# Patient Record
Sex: Female | Born: 1945 | Race: White | Hispanic: No | Smoking: Never smoker
Health system: Southern US, Community
[De-identification: ages and names within clinical notes are randomized; demographics above are authoritative.]

## PROBLEM LIST (undated history)

## (undated) DIAGNOSIS — K219 Gastro-esophageal reflux disease without esophagitis: Secondary | ICD-10-CM

## (undated) DIAGNOSIS — E876 Hypokalemia: Secondary | ICD-10-CM

## (undated) DIAGNOSIS — M25511 Pain in right shoulder: Secondary | ICD-10-CM

## (undated) DIAGNOSIS — I4891 Unspecified atrial fibrillation: Secondary | ICD-10-CM

## (undated) DIAGNOSIS — M5030 Other cervical disc degeneration, unspecified cervical region: Secondary | ICD-10-CM

## (undated) DIAGNOSIS — G589 Mononeuropathy, unspecified: Secondary | ICD-10-CM

## (undated) DIAGNOSIS — I340 Nonrheumatic mitral (valve) insufficiency: Secondary | ICD-10-CM

## (undated) DIAGNOSIS — Z9889 Other specified postprocedural states: Secondary | ICD-10-CM

## (undated) DIAGNOSIS — G4730 Sleep apnea, unspecified: Secondary | ICD-10-CM

## (undated) DIAGNOSIS — T8859XA Other complications of anesthesia, initial encounter: Secondary | ICD-10-CM

## (undated) DIAGNOSIS — E785 Hyperlipidemia, unspecified: Secondary | ICD-10-CM

## (undated) DIAGNOSIS — I421 Obstructive hypertrophic cardiomyopathy: Secondary | ICD-10-CM

## (undated) DIAGNOSIS — R609 Edema, unspecified: Secondary | ICD-10-CM

## (undated) DIAGNOSIS — R112 Nausea with vomiting, unspecified: Secondary | ICD-10-CM

## (undated) DIAGNOSIS — Z95810 Presence of automatic (implantable) cardiac defibrillator: Secondary | ICD-10-CM

## (undated) DIAGNOSIS — F419 Anxiety disorder, unspecified: Secondary | ICD-10-CM

## (undated) DIAGNOSIS — K469 Unspecified abdominal hernia without obstruction or gangrene: Secondary | ICD-10-CM

## (undated) DIAGNOSIS — M549 Dorsalgia, unspecified: Secondary | ICD-10-CM

## (undated) DIAGNOSIS — R0602 Shortness of breath: Secondary | ICD-10-CM

## (undated) DIAGNOSIS — T4145XA Adverse effect of unspecified anesthetic, initial encounter: Secondary | ICD-10-CM

## (undated) DIAGNOSIS — IMO0001 Reserved for inherently not codable concepts without codable children: Secondary | ICD-10-CM

## (undated) DIAGNOSIS — Z950 Presence of cardiac pacemaker: Secondary | ICD-10-CM

## (undated) HISTORY — PX: OTHER SURGICAL HISTORY: SHX169

## (undated) HISTORY — PX: PACEMAKER INSERTION: SHX728

## (undated) HISTORY — DX: Nonrheumatic mitral (valve) insufficiency: I34.0

## (undated) HISTORY — DX: Edema, unspecified: R60.9

## (undated) HISTORY — DX: Hyperlipidemia, unspecified: E78.5

## (undated) HISTORY — DX: Obstructive hypertrophic cardiomyopathy: I42.1

## (undated) HISTORY — DX: Hypokalemia: E87.6

## (undated) HISTORY — DX: Unspecified abdominal hernia without obstruction or gangrene: K46.9

## (undated) HISTORY — PX: CORONARY ARTERY BYPASS GRAFT: SHX141

## (undated) HISTORY — PX: VAGINAL HYSTERECTOMY: SUR661

## (undated) HISTORY — PX: MYOMECTOMY: SHX85

## (undated) HISTORY — DX: Unspecified atrial fibrillation: I48.91

## (undated) HISTORY — PX: TONSILLECTOMY: SUR1361

## (undated) HISTORY — DX: Mononeuropathy, unspecified: G58.9

## (undated) HISTORY — DX: Other cervical disc degeneration, unspecified cervical region: M50.30

## (undated) HISTORY — DX: Gastro-esophageal reflux disease without esophagitis: K21.9

## (undated) HISTORY — DX: Pain in right shoulder: M25.511

## (undated) HISTORY — PX: ICD GENERATOR CHANGE: SHX5854

## (undated) HISTORY — DX: Shortness of breath: R06.02

## (undated) HISTORY — DX: Anxiety disorder, unspecified: F41.9

## (undated) HISTORY — PX: CARDIAC CATHETERIZATION: SHX172

## (undated) HISTORY — DX: Sleep apnea, unspecified: G47.30

## (undated) HISTORY — DX: Reserved for inherently not codable concepts without codable children: IMO0001

## (undated) HISTORY — DX: Presence of automatic (implantable) cardiac defibrillator: Z95.810

## (undated) HISTORY — DX: Dorsalgia, unspecified: M54.9

---

## 1998-04-06 ENCOUNTER — Observation Stay (HOSPITAL_COMMUNITY): Admission: EM | Admit: 1998-04-06 | Discharge: 1998-04-07 | Payer: Self-pay | Admitting: Emergency Medicine

## 1998-04-06 ENCOUNTER — Encounter: Payer: Self-pay | Admitting: Cardiology

## 1999-05-09 ENCOUNTER — Encounter: Payer: Self-pay | Admitting: Emergency Medicine

## 1999-05-09 ENCOUNTER — Inpatient Hospital Stay (HOSPITAL_COMMUNITY): Admission: EM | Admit: 1999-05-09 | Discharge: 1999-05-09 | Payer: Self-pay | Admitting: Emergency Medicine

## 2000-07-19 ENCOUNTER — Other Ambulatory Visit: Admission: RE | Admit: 2000-07-19 | Discharge: 2000-07-19 | Payer: Self-pay | Admitting: Family Medicine

## 2001-03-06 ENCOUNTER — Encounter: Payer: Self-pay | Admitting: Internal Medicine

## 2001-03-06 ENCOUNTER — Ambulatory Visit (HOSPITAL_COMMUNITY): Admission: RE | Admit: 2001-03-06 | Discharge: 2001-03-06 | Payer: Self-pay | Admitting: Internal Medicine

## 2002-03-07 ENCOUNTER — Encounter: Payer: Self-pay | Admitting: Emergency Medicine

## 2002-03-07 ENCOUNTER — Emergency Department (HOSPITAL_COMMUNITY): Admission: EM | Admit: 2002-03-07 | Discharge: 2002-03-07 | Payer: Self-pay | Admitting: Emergency Medicine

## 2002-06-04 ENCOUNTER — Ambulatory Visit (HOSPITAL_COMMUNITY): Admission: RE | Admit: 2002-06-04 | Discharge: 2002-06-04 | Payer: Self-pay | Admitting: Internal Medicine

## 2002-06-04 ENCOUNTER — Encounter: Payer: Self-pay | Admitting: Internal Medicine

## 2002-09-20 ENCOUNTER — Encounter: Payer: Self-pay | Admitting: Internal Medicine

## 2002-09-20 ENCOUNTER — Ambulatory Visit (HOSPITAL_COMMUNITY): Admission: RE | Admit: 2002-09-20 | Discharge: 2002-09-20 | Payer: Self-pay | Admitting: Internal Medicine

## 2004-04-21 ENCOUNTER — Ambulatory Visit: Payer: Self-pay | Admitting: Internal Medicine

## 2004-08-06 ENCOUNTER — Ambulatory Visit: Payer: Self-pay | Admitting: Internal Medicine

## 2016-02-11 ENCOUNTER — Telehealth: Payer: Self-pay | Admitting: Internal Medicine

## 2016-02-11 NOTE — Telephone Encounter (Signed)
Records received via mail form [facility]- placed in chart prep room patient needs to make appointment

## 2016-02-17 DIAGNOSIS — M79609 Pain in unspecified limb: Secondary | ICD-10-CM | POA: Insufficient documentation

## 2016-02-17 DIAGNOSIS — M79604 Pain in right leg: Secondary | ICD-10-CM

## 2016-02-17 DIAGNOSIS — IMO0002 Reserved for concepts with insufficient information to code with codable children: Secondary | ICD-10-CM | POA: Insufficient documentation

## 2016-02-17 DIAGNOSIS — I422 Other hypertrophic cardiomyopathy: Secondary | ICD-10-CM | POA: Insufficient documentation

## 2016-02-17 DIAGNOSIS — M7062 Trochanteric bursitis, left hip: Secondary | ICD-10-CM

## 2016-02-17 DIAGNOSIS — M7060 Trochanteric bursitis, unspecified hip: Secondary | ICD-10-CM | POA: Insufficient documentation

## 2016-02-17 DIAGNOSIS — E876 Hypokalemia: Secondary | ICD-10-CM | POA: Insufficient documentation

## 2016-02-17 DIAGNOSIS — S46011A Strain of muscle(s) and tendon(s) of the rotator cuff of right shoulder, initial encounter: Secondary | ICD-10-CM

## 2016-02-17 DIAGNOSIS — M25511 Pain in right shoulder: Secondary | ICD-10-CM | POA: Insufficient documentation

## 2016-02-17 DIAGNOSIS — M542 Cervicalgia: Secondary | ICD-10-CM | POA: Insufficient documentation

## 2016-02-17 DIAGNOSIS — Z780 Asymptomatic menopausal state: Secondary | ICD-10-CM | POA: Insufficient documentation

## 2016-02-17 DIAGNOSIS — I421 Obstructive hypertrophic cardiomyopathy: Secondary | ICD-10-CM | POA: Insufficient documentation

## 2016-02-17 DIAGNOSIS — M5030 Other cervical disc degeneration, unspecified cervical region: Secondary | ICD-10-CM

## 2016-02-17 DIAGNOSIS — G4733 Obstructive sleep apnea (adult) (pediatric): Secondary | ICD-10-CM | POA: Insufficient documentation

## 2016-02-17 DIAGNOSIS — M810 Age-related osteoporosis without current pathological fracture: Secondary | ICD-10-CM | POA: Insufficient documentation

## 2016-02-17 DIAGNOSIS — I340 Nonrheumatic mitral (valve) insufficiency: Secondary | ICD-10-CM | POA: Insufficient documentation

## 2016-02-17 DIAGNOSIS — M12819 Other specific arthropathies, not elsewhere classified, unspecified shoulder: Secondary | ICD-10-CM

## 2016-02-17 DIAGNOSIS — M6580 Other synovitis and tenosynovitis, unspecified site: Secondary | ICD-10-CM | POA: Insufficient documentation

## 2016-02-17 DIAGNOSIS — Z95810 Presence of automatic (implantable) cardiac defibrillator: Secondary | ICD-10-CM | POA: Insufficient documentation

## 2016-03-04 ENCOUNTER — Encounter: Payer: Self-pay | Admitting: Internal Medicine

## 2016-05-13 ENCOUNTER — Encounter: Payer: Self-pay | Admitting: Internal Medicine

## 2016-06-10 ENCOUNTER — Encounter: Payer: Self-pay | Admitting: Internal Medicine

## 2016-07-28 ENCOUNTER — Encounter: Payer: Self-pay | Admitting: Internal Medicine

## 2016-07-30 ENCOUNTER — Encounter: Payer: Self-pay | Admitting: Internal Medicine

## 2016-07-30 ENCOUNTER — Encounter (INDEPENDENT_AMBULATORY_CARE_PROVIDER_SITE_OTHER): Payer: Self-pay

## 2016-07-30 ENCOUNTER — Ambulatory Visit (INDEPENDENT_AMBULATORY_CARE_PROVIDER_SITE_OTHER): Payer: Medicare Other | Admitting: Internal Medicine

## 2016-07-30 VITALS — BP 100/70 | HR 67 | Ht 62.0 in | Wt 154.2 lb

## 2016-07-30 DIAGNOSIS — I422 Other hypertrophic cardiomyopathy: Secondary | ICD-10-CM | POA: Diagnosis not present

## 2016-07-30 DIAGNOSIS — I480 Paroxysmal atrial fibrillation: Secondary | ICD-10-CM

## 2016-07-30 DIAGNOSIS — Z950 Presence of cardiac pacemaker: Secondary | ICD-10-CM | POA: Diagnosis not present

## 2016-07-30 LAB — CUP PACEART INCLINIC DEVICE CHECK
HighPow Impedance: 48 Ohm
HighPow Impedance: 64 Ohm
Implantable Lead Implant Date: 19970916
Implantable Lead Model: 125
Implantable Lead Model: 4269
Implantable Lead Serial Number: 222062
Lead Channel Pacing Threshold Amplitude: 0.5 V
Lead Channel Pacing Threshold Pulse Width: 2 ms
Lead Channel Sensing Intrinsic Amplitude: 20.3 mV
Lead Channel Setting Pacing Amplitude: 2 V
Lead Channel Setting Pacing Amplitude: 2.5 V
Lead Channel Setting Pacing Pulse Width: 2 ms
Lead Channel Setting Sensing Sensitivity: 0.6 mV
MDC IDC LEAD IMPLANT DT: 19970916
MDC IDC LEAD LOCATION: 753859
MDC IDC LEAD LOCATION: 753860
MDC IDC LEAD SERIAL: 279972
MDC IDC MSMT LEADCHNL RA IMPEDANCE VALUE: 381 Ohm
MDC IDC MSMT LEADCHNL RA PACING THRESHOLD PULSEWIDTH: 2 ms
MDC IDC MSMT LEADCHNL RA SENSING INTR AMPL: 1.5 mV
MDC IDC MSMT LEADCHNL RV IMPEDANCE VALUE: 465 Ohm
MDC IDC MSMT LEADCHNL RV PACING THRESHOLD AMPLITUDE: 0.6 V
MDC IDC PG IMPLANT DT: 20040112
MDC IDC SESS DTM: 20180309050000
Pulse Gen Serial Number: 149386

## 2016-07-30 MED ORDER — FUROSEMIDE 40 MG PO TABS: 40.0000 mg | ORAL_TABLET | Freq: Every day | ORAL | 6 refills | Status: DC

## 2016-07-30 MED ORDER — APIXABAN 5 MG PO TABS: 5.0000 mg | ORAL_TABLET | Freq: Two times a day (BID) | ORAL | 11 refills | Status: DC

## 2016-07-30 MED ORDER — APIXABAN 5 MG PO TABS: 5.0000 mg | ORAL_TABLET | Freq: Two times a day (BID) | ORAL | 0 refills | Status: DC

## 2016-07-30 NOTE — Progress Notes (Signed)
ELECTROPHYSIOLOGY CONSULT NOTE  Patient ID: Aimee Edwards, MRN: [id number], DOB/AGE: 1945-11-26 71 y.o. Admit date: (Not on file) Date of Consult: 07/30/2016  Primary Physician: PROVIDER NOT IN SYSTEM Primary Cardiologist: new Consulting Physician new  Chief Complaint: reestablish   HPI Aimee Edwards is a 71 y.o. female  Return here here following many many years of care in [place].  Many years ago, Dr Cecilie Kicks and I sent her t ahistory of hypertrrophic obstructive cardiomyopathy   cardiomyopathy At the [facility] in 1996 and underwent septal myectomy  And ICD implantation.  Records note that there is an issue with atrial lead function.  The patient reports that she was hospitalized at [facility] about 15 years ago because of inappropriate shocks related to atrial fibrillation. She was started on anticoagulation and subsequently this was discontinued by her [facility] physicians. On her own she began aspirin.  She has had progressive shortness of breath and exercise intolerance. She's had significant mitral regurgitation dating back probably 5 or 6 years With comments in the chart about MR over estimation.  She recently saw a different physician who obtained a trans-thoracic echo and made a comment regarding "have to get the ejection fraction back to 35%". Reviewing outpatient records there was an echo 6/17 where in the ejection fraction was noted at 47% again with severe mitral regurgitation; she was also noted to have moderate aortic insufficiency.  She's had no appropriate ICD discharges.           Past Medical History:  Diagnosis Date  . Back pain   . Cardiomyopathy, hypertrophic obstructive (HCC)   . Degeneration of cervical intervertebral disc   . Dyslipidemia   . Dysrhythmia   . Edema   . Gastroesophageal reflux disease   . Hernia, abdominal   . Hypokalemia   . ICD (implantable cardioverter-defibrillator) in place   . Murmur   . Pacemaker   . Pinched nerve   .  Presence of automatic cardioverter/defibrillator (AICD)   . Reflux   . Right shoulder pain   . Shortness of breath   . Shortness of breath on exertion   . Sleep apnea       Surgical History:  Past Surgical History:  Procedure Laterality Date  . CARDIAC CATHETERIZATION    . COLONSCOPY    . CORONARY ARTERY BYPASS GRAFT    . ICD GENERATOR CHANGE    . MYOMECTOMY    . OTHER HEART/PERICARD OPS    . PACEMAKER INSERTION    . TONSILLECTOMY    . VAGINAL HYSTERECTOMY       Home Meds: Prior to Admission medications   Medication Sig Start Date End Date Taking? Authorizing Provider  aspirin EC 81 MG tablet Take 81 mg by mouth daily.   Yes Historical Provider, MD  atorvastatin (LIPITOR) 40 MG tablet Take 40 mg by mouth daily at 6 PM.   Yes Historical Provider, MD  furosemide (LASIX) 20 MG tablet Take 1 tablet by mouth daily. 11/20/15  Yes Historical Provider, MD  lisinopril (PRINIVIL,ZESTRIL) 5 MG tablet Take 5 mg by mouth 2 (two) times daily.    Yes Historical Provider, MD  metoprolol succinate (TOPROL-XL) 25 MG 24 hr tablet Take 25 mg by mouth daily.   Yes Historical Provider, MD  potassium chloride (K-DUR,KLOR-CON) 10 MEQ tablet Take 1 tablet by mouth daily. 11/20/15  Yes Historical Provider, MD  spironolactone (ALDACTONE) 25 MG tablet Take 25 mg by mouth daily.   Yes Historical Provider,  MD    Allergies:  Allergies  Allergen Reactions  . Codeine Nausea And Vomiting  . Lortab [Hydrocodone-Acetaminophen] Nausea And Vomiting    Social History   Social History  . Marital status: Divorced    Spouse name: N/A  . Number of children: N/A  . Years of education: N/A   Occupational History  . Not on file.   Social History Main Topics  . Smoking status: Never Smoker  . Smokeless tobacco: Never Used  . Alcohol use No  . Drug use: No  . Sexual activity: No   Other Topics Concern  . Not on file   Social History Narrative  . No narrative on file     Family History  Problem  Relation Age of Onset  . Heart disease Brother   . Cancer Brother      ROS:  Please see the history of present illness.     All other systems reviewed and negative.    Physical Exam:  Blood pressure 100/70, pulse 67, height 5\' 2"  (1.575 m), weight 154 lb 3.2 oz (69.9 kg), SpO2 96 %. General: Well developed, well nourished female in no acute distress. Head: Normocephalic, atraumatic, sclera non-icteric, no xanthomas, nares are without discharge. EENT: normal  Lymph Nodes:  none Neck: Negative for carotid bruits. JVD not elevated. Back:without scoliosis kyphosis Lungs: Clear bilaterally to auscultation without wheezes, rales, or rhonchi. Breathing is unlabored. Heart: RRR with S1 S2. 2/6 murmur . No rubs, or gallops appreciated. Abdomen: Soft, non-tender, non-distended with normoactive bowel sounds. No hepatomegaly. No rebound/guarding. No obvious abdominal masses. Msk:  Strength and tone appear normal for age. Extremities: No clubbing or cyanosis. No  edema.  Distal pedal pulses are 2+ and equal bilaterally. Skin: Warm and Dry Neuro: Alert and oriented X 3. CN III-XII intact Grossly normal sensory and motor function . Psych:  Responds to questions appropriately with a normal affect.      Labs: Cardiac Enzymes No results for input(s): CKTOTAL, CKMB, TROPONINI in the last 72 hours. CBC No results found for: WBC, HGB, HCT, MCV, PLT PROTIME: No results for input(s): LABPROT, INR in the last 72 hours. Chemistry No results for input(s): NA, K, CL, CO2, BUN, CREATININE, CALCIUM, PROT, BILITOT, ALKPHOS, ALT, AST, GLUCOSE in the last 168 hours.  Invalid input(s): LABALBU Lipids No results found for: CHOL, HDL, LDLCALC, TRIG BNP No results found for: PROBNP Thyroid Function Tests: No results for input(s): TSH, T4TOTAL, T3FREE, THYROIDAB in the last 72 hours.  Invalid input(s): FREET3 Miscellaneous No results found for: DDIMER  Radiology/Studies:  No results found.  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 Indication  Atrial fibrillation-paroxysmal-recurrent with inappropriate ICD shocks  Congestive heart failure-progressive-class III  Mitral regurgitation/aortic regurgitation question severity moderate-severe by interval echoes    The patient presents many years after myectomy with interval reports reviewed from the [facility] program , continue on mitral regurgitation  moderate to severe and most recent echo report June 17 EF 45%. There is a suggestion from that interval echo that there has been significant worsening of LV function to explain progressive shortness of breath and fatigue. I have reviewed this with Dr. Tenny Craw today. Reassessment by transthoracic echo is the first next step. We will attempt to do this by reviewing the most recent echo.  Heart failure status is improved with the up  titration of her Aldactone. We will down try treated back to 25 mg daily we'll increase her furosemide and 20-40. We'll check a metabolic profile. Her heart rate histograms suggest atrial fibrillation though we do not have documentation. The device was programmed D DI-DDD so as to allow us to record atrial electrograms. In the interim, however, given her history of atrial fibrillation of which she is aware we will discontinue aspirin and begin her on apixoban.  She may end up needing trans-esophageal echo; Dr. Tenny Crawoss will help guide that next decision.

## 2016-07-30 NOTE — Patient Instructions (Addendum)
Medication Instructions: - Your physician has recommended you make the following change in your medication: 1) Stop aspirin 2) Decrease aldactone (spironolactone) 25 mg- take one tablet by mouth once daily 3) Increase lasix (furosemide) 40 mg- take one tablet by mouth once daily 4) Start eliquis 5 mg- take one tablet by mouth twice daily  Labwork: - Your physician recommends that you have lab work today: [employer]  Procedures/Testing: - none ordered  Follow-Up: - pending  Any Additional Special Instructions Will Be Listed Below (If Applicable). - Please obtain a copy of your most recent echocardiogram on a disk for Dr. Graciela HusbandsKlein/ Dr. Tenny Crawoss to review (attn: Herbert SetaHeather, RN)     If you need a refill on your cardiac medications before your next appointment, please call your pharmacy.

## 2016-07-31 LAB — BASIC METABOLIC PANEL
BUN / CREAT RATIO: 27 (ref 12–28)
BUN: 17 mg/dL (ref 8–27)
CALCIUM: 9.6 mg/dL (ref 8.7–10.3)
CHLORIDE: 102 mmol/L (ref 96–106)
CO2: 25 mmol/L (ref 18–29)
Creatinine, Ser: 0.63 mg/dL (ref 0.57–1.00)
GFR, EST AFRICAN AMERICAN: 104 mL/min/{1.73_m2} (ref >59)
GFR, EST NON AFRICAN AMERICAN: 91 mL/min/{1.73_m2} (ref >59)
Glucose: 85 mg/dL (ref 65–99)
POTASSIUM: 4.6 mmol/L (ref 3.5–5.2)
Sodium: 145 mmol/L — ABNORMAL HIGH (ref 134–144)

## 2016-08-03 ENCOUNTER — Telehealth: Payer: Self-pay | Admitting: Internal Medicine

## 2016-08-03 ENCOUNTER — Telehealth: Payer: Self-pay

## 2016-08-03 NOTE — Telephone Encounter (Signed)
The patient is aware of her results.  

## 2016-08-03 NOTE — Telephone Encounter (Signed)
Mrs.Bermingham is returning a call . She will be at this number [phone] until  5 or 5:30pm . Please call

## 2016-08-03 NOTE — Telephone Encounter (Signed)
Left message with husband for pateint to call for lab results

## 2016-08-06 ENCOUNTER — Other Ambulatory Visit: Payer: Self-pay | Admitting: Internal Medicine

## 2016-08-06 ENCOUNTER — Encounter: Payer: Self-pay | Admitting: Internal Medicine

## 2016-08-06 MED ORDER — SPIRONOLACTONE 25 MG PO TABS: 25.0000 mg | ORAL_TABLET | Freq: Every day | ORAL | 3 refills | Status: DC

## 2016-08-06 MED ORDER — POTASSIUM CHLORIDE CRYS ER 10 MEQ PO TBCR
10.0000 meq | EXTENDED_RELEASE_TABLET | Freq: Every day | ORAL | 3 refills | Status: DC
Start: 2016-08-06 — End: 2018-01-03

## 2016-08-06 NOTE — Telephone Encounter (Signed)
Pt's Rx was sent to pt's pharmacy as requested. Confirmation received.  °

## 2016-08-10 ENCOUNTER — Encounter: Payer: Self-pay | Admitting: Internal Medicine

## 2016-08-26 ENCOUNTER — Telehealth: Payer: Self-pay | Admitting: Internal Medicine

## 2016-08-26 DIAGNOSIS — I517 Cardiomegaly: Secondary | ICD-10-CM

## 2016-08-26 NOTE — Telephone Encounter (Signed)
I called and spoke with the patient per Dr. Odessa Fleming request. He and Dr. Tenny Craw have looked at the copy of her echo on disk compared to the written report. They are uncertain that what they are seeing on the disk correlates with the written report. He asked that the study be repeated in our office to verify. The patient is agreeable. I advised I will place the order and scheduling will call her to arrange.

## 2016-09-01 ENCOUNTER — Other Ambulatory Visit: Payer: Self-pay

## 2016-09-01 ENCOUNTER — Ambulatory Visit (HOSPITAL_COMMUNITY): Payer: Medicare Other | Attending: Cardiology

## 2016-09-01 DIAGNOSIS — I517 Cardiomegaly: Secondary | ICD-10-CM

## 2016-09-01 DIAGNOSIS — I083 Combined rheumatic disorders of mitral, aortic and tricuspid valves: Secondary | ICD-10-CM | POA: Insufficient documentation

## 2016-09-01 MED ORDER — PERFLUTREN LIPID MICROSPHERE
1.0000 mL | INTRAVENOUS | Status: AC | PRN
  Administered 2016-09-01: 2 mL via INTRAVENOUS

## 2016-09-23 ENCOUNTER — Encounter: Payer: Self-pay | Admitting: Internal Medicine

## 2016-09-30 ENCOUNTER — Telehealth: Payer: Self-pay

## 2016-09-30 NOTE — Telephone Encounter (Signed)
Alternatives to eliquis are pradaxa and xarelto. If she wants to check with her pharmacy and see if they can quote her a cost on these 2 to see if one is more affordable for her.  Otherwise, it would be warfarin.

## 2016-09-30 NOTE — Telephone Encounter (Signed)
Patient calling saying that Eliquis cost her $500 a month and she cannot afford that and would like to know if there is an alternative to medication that Dr. Graciela HusbandsKlein could prescribe that would be more affordable for her. I informed her that Dr. Graciela HusbandsKlein is out of town this entire week and will not be back in the office until next week but that I could send his nurse a message and upon his return we would advise her. She is agreeable to plan.

## 2016-10-04 ENCOUNTER — Encounter: Payer: Self-pay | Admitting: Internal Medicine

## 2016-10-14 ENCOUNTER — Encounter: Payer: Self-pay | Admitting: Internal Medicine

## 2016-10-20 ENCOUNTER — Other Ambulatory Visit: Payer: Self-pay | Admitting: Pharmacist

## 2016-10-20 MED ORDER — WARFARIN SODIUM 5 MG PO TABS: 5.0000 mg | ORAL_TABLET | Freq: Every day | ORAL | 1 refills | Status: DC

## 2016-10-22 ENCOUNTER — Encounter: Payer: Self-pay | Admitting: Internal Medicine

## 2016-10-22 NOTE — Progress Notes (Signed)
F[facility] [place]- these were signed by Dr. Graciela HusbandsKlein for the patient to be followed there (closer to her house). Faxed to [phone]- confirmation received.

## 2016-11-01 ENCOUNTER — Ambulatory Visit (INDEPENDENT_AMBULATORY_CARE_PROVIDER_SITE_OTHER): Payer: Medicare Other | Admitting: *Deleted

## 2016-11-01 ENCOUNTER — Telehealth: Payer: Self-pay | Admitting: *Deleted

## 2016-11-01 DIAGNOSIS — I422 Other hypertrophic cardiomyopathy: Secondary | ICD-10-CM | POA: Diagnosis not present

## 2016-11-01 NOTE — Telephone Encounter (Signed)
Called pt back and let her know that her transmission was received.

## 2016-11-01 NOTE — Telephone Encounter (Signed)
Patient calling, was confused about her home remote check date and would like to verify if transmission was received. Patient states that the light was blinking this morning and would like to verify that transmission was received. Patient asks that you leave message via MyChart.  Thanks.

## 2016-11-01 NOTE — Progress Notes (Signed)
Remote ICD transmission.   

## 2016-11-03 ENCOUNTER — Encounter: Payer: Self-pay | Admitting: Cardiology

## 2016-11-03 ENCOUNTER — Telehealth: Payer: Self-pay | Admitting: Internal Medicine

## 2016-11-03 LAB — CUP PACEART REMOTE DEVICE CHECK
Brady Statistic RA Percent Paced: 1 %
Date Time Interrogation Session: 20180613155411
HighPow Impedance: 64 Ohm
Implantable Lead Model: 4269
Lead Channel Impedance Value: 387 Ohm
MDC IDC LEAD IMPLANT DT: 19970916
MDC IDC LEAD IMPLANT DT: 19970916
MDC IDC LEAD LOCATION: 753859
MDC IDC LEAD LOCATION: 753860
MDC IDC LEAD SERIAL: 222062
MDC IDC LEAD SERIAL: 279972
MDC IDC MSMT LEADCHNL RA SENSING INTR AMPL: 2 mV
MDC IDC MSMT LEADCHNL RV IMPEDANCE VALUE: 469 Ohm
MDC IDC MSMT LEADCHNL RV SENSING INTR AMPL: 19 mV
MDC IDC PG IMPLANT DT: 20101020
MDC IDC PG SERIAL: 149386
MDC IDC STAT BRADY RV PERCENT PACED: 1 %

## 2016-11-03 NOTE — Telephone Encounter (Signed)
New message      Pt is on coumadin.  [place] It [place] 2.6.  Pt is feeling drunk/dizzy since this am. Please advise

## 2016-11-03 NOTE — Telephone Encounter (Signed)
I called and spoke with the patient. She states she woke up this morning feeling "drunk" but she hasn't been drinking anything. She denies headaches. She has not done any blood pressure check as she is at work. I advised her I spoke with our pharmacist in the coumadin clinic and she did not feel that coumadin would cause the patient to feel this way.  The patient has had problems with vertigo in the past, but that felt like the room was spinning and this feels like she is off balance. She has meclizine at home- I have advised her to try this when she gets home to see if it will help. If it does not, I have advised her to follow up with her PCP. She voices understanding.

## 2016-11-03 NOTE — Telephone Encounter (Signed)
Attempted to call the patient- # is ringing busy at this time. Will call back.

## 2016-12-15 ENCOUNTER — Ambulatory Visit (INDEPENDENT_AMBULATORY_CARE_PROVIDER_SITE_OTHER): Payer: Medicare Other | Admitting: Internal Medicine

## 2016-12-15 VITALS — BP 100/66 | HR 69 | Ht 62.0 in | Wt 163.0 lb

## 2016-12-15 DIAGNOSIS — I422 Other hypertrophic cardiomyopathy: Secondary | ICD-10-CM

## 2016-12-15 DIAGNOSIS — E782 Mixed hyperlipidemia: Secondary | ICD-10-CM | POA: Diagnosis not present

## 2016-12-15 DIAGNOSIS — Z95810 Presence of automatic (implantable) cardiac defibrillator: Secondary | ICD-10-CM

## 2016-12-15 DIAGNOSIS — I480 Paroxysmal atrial fibrillation: Secondary | ICD-10-CM | POA: Diagnosis not present

## 2016-12-15 DIAGNOSIS — I509 Heart failure, unspecified: Secondary | ICD-10-CM | POA: Diagnosis not present

## 2016-12-15 DIAGNOSIS — I359 Nonrheumatic aortic valve disorder, unspecified: Secondary | ICD-10-CM

## 2016-12-15 DIAGNOSIS — I059 Rheumatic mitral valve disease, unspecified: Secondary | ICD-10-CM

## 2016-12-15 MED ORDER — WARFARIN SODIUM 5 MG PO TABS: ORAL_TABLET | ORAL | 2 refills | Status: DC

## 2016-12-15 MED ORDER — FUROSEMIDE 40 MG PO TABS: 40.0000 mg | ORAL_TABLET | Freq: Every day | ORAL | 6 refills | Status: DC

## 2016-12-15 MED ORDER — ATORVASTATIN CALCIUM 40 MG PO TABS: 40.0000 mg | ORAL_TABLET | Freq: Every day | ORAL | 6 refills | Status: DC

## 2016-12-15 MED ORDER — METOPROLOL SUCCINATE ER 25 MG PO TB24: 25.0000 mg | ORAL_TABLET | Freq: Every day | ORAL | 6 refills | Status: DC

## 2016-12-15 NOTE — Progress Notes (Signed)
Patient Care Team: System, Provider Not In as PCP - General   HPI  Aimee Edwards is a 71 y.o. female Seen in follow-up for hypertrophic cardiomyopathy status post septal myectomy. She has a history of ICD implantation. She has had interval atrial fibrillation resulting in inappropriate ICD discharges. She's been managed with anticoagulation.  She has a history of mitral regurgitation with comments suggesting severe MR and moderate AI.  Functional status is baseline with mild-moderate shortness of breath.without orthopnea or PND  There is been no edema. No chest pain   4/18 and echocardiogram here demonstrated EF 45-50% with moderate MR and severe LAE (43/2.4/58)    Past Medical History:  Diagnosis Date  . Atrial fibrillation (HCC)   . Back pain   . Cardiomyopathy, hypertrophic obstructive (HCC)   . Degeneration of cervical intervertebral disc   . Dyslipidemia   . Edema   . Gastroesophageal reflux disease   . Hernia, abdominal   . Hypokalemia   . ICD (implantable cardioverter-defibrillator) in place   . Mitral regurgitation   . Pinched nerve   . Reflux   . Right shoulder pain   . Shortness of breath on exertion   . Sleep apnea     Past Surgical History:  Procedure Laterality Date  . CARDIAC CATHETERIZATION    . COLONSCOPY    . CORONARY ARTERY BYPASS GRAFT    . ICD GENERATOR CHANGE    . MYOMECTOMY    . OTHER HEART/PERICARD OPS    . PACEMAKER INSERTION    . TONSILLECTOMY    . VAGINAL HYSTERECTOMY      Current Outpatient Prescriptions  Medication Sig Dispense Refill  . atorvastatin (LIPITOR) 40 MG tablet Take 40 mg by mouth daily at 6 PM.    . furosemide (LASIX) 40 MG tablet Take 1 tablet (40 mg total) by mouth daily. 30 tablet 6  . lisinopril (PRINIVIL,ZESTRIL) 5 MG tablet Take 5 mg by mouth 2 (two) times daily.     . metoprolol succinate (TOPROL-XL) 25 MG 24 hr tablet Take 25 mg by mouth daily.    . potassium chloride (K-DUR,KLOR-CON) 10 MEQ tablet  Take 1 tablet (10 mEq total) by mouth daily. 90 tablet 3  . spironolactone (ALDACTONE) 25 MG tablet Take 1 tablet (25 mg total) by mouth daily. 90 tablet 3  . warfarin (COUMADIN) 5 MG tablet Take 1 tablet (5 mg total) by mouth daily. 30 tablet 1   No current facility-administered medications for this visit.     Allergies  Allergen Reactions  . Codeine Nausea And Vomiting  . Lortab [Hydrocodone-Acetaminophen] Nausea And Vomiting      Review of Systems negative except from HPI and PMH  Physical Exam BP 100/66   Pulse 69   Ht 5\' 2"  (1.575 m)   Wt 163 lb (73.9 kg)   SpO2 98%   BMI 29.81 kg/m  Well developed and well nourished in no acute distress HENT normal E scleral and icterus clear Neck Supple JVP flat; carotids brisk and full Clear to ausculation Device pocket well healed; without hematoma or erythema.  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 Indication  Atrial  fibrillation-paroxysmal-recurrent with inappropriate ICD shocks  Congestive heart failure-progressive-class III  Mitral regurgitation/aortic regurgitation question severity moderate  Labile INR   Have  Encouraged her to look at patient assistance for NOAC rx as an alternative given labile INR  Have spoken with Dr Tenny Crawoss,  MR is central and moderate but not severe >> will continue medical therapy for MR  Medication uptitration limited by BP   No intercurrent atrial fibrillation or flutter  Lots of inappropriate atrial sensing   Impending lead failure       Current medicines are reviewed at length with the patient today .  The patient does not  have concerns regarding  medicines.

## 2016-12-15 NOTE — Patient Instructions (Addendum)
Medication Instructions: - Your physician recommends that you continue on your current medications as directed. Please refer to the Current Medication list given to you today.  Labwork: - Your physician recommends that you have lab work today: BMP/lipid  Procedures/Testing: - none ordered  Follow-Up: - Remote monitoring is used to monitor your Pacemaker of ICD from home. This monitoring reduces the number of office visits required to check your device to one time per year. It allows us to keep an eye on the functioning of your device to ensure it is working properly. You are scheduled for a device check from home on 01/31/17. You may send your transmission at any time that day. If you have a wireless device, the transmission will be sent automatically. After your physician reviews your transmission, you will receive a postcard with your next transmission date.  - Your physician wants you to follow-up in: 6 months with Dr. Graciela HusbandsKlein. You will receive a reminder letter in the mail two months in advance. If you don't receive a letter, please call our office to schedule the follow-up appointment.   Any Additional Special Instructions Will Be Listed Below (If Applicable).     If you need a refill on your cardiac medications before your next appointment, please call your pharmacy.

## 2016-12-16 LAB — BASIC METABOLIC PANEL
BUN / CREAT RATIO: 16 (ref 12–28)
BUN: 14 mg/dL (ref 8–27)
CO2: 26 mmol/L (ref 20–29)
Calcium: 9.3 mg/dL (ref 8.7–10.3)
Chloride: 104 mmol/L (ref 96–106)
Creatinine, Ser: 0.9 mg/dL (ref 0.57–1.00)
GFR calc Af Amer: 74 mL/min/{1.73_m2} (ref >59)
GFR, EST NON AFRICAN AMERICAN: 65 mL/min/{1.73_m2} (ref >59)
Glucose: 98 mg/dL (ref 65–99)
POTASSIUM: 4.1 mmol/L (ref 3.5–5.2)
SODIUM: 144 mmol/L (ref 134–144)

## 2016-12-16 LAB — LIPID PANEL
CHOL/HDL RATIO: 2.8 ratio (ref 0.0–4.4)
Cholesterol, Total: 161 mg/dL (ref 100–199)
HDL: 58 mg/dL (ref >39)
LDL Calculated: 81 mg/dL (ref 0–99)
Triglycerides: 110 mg/dL (ref 0–149)
VLDL Cholesterol Cal: 22 mg/dL (ref 5–40)

## 2016-12-17 ENCOUNTER — Other Ambulatory Visit: Payer: Self-pay | Admitting: Internal Medicine

## 2016-12-17 ENCOUNTER — Encounter: Payer: Self-pay | Admitting: Internal Medicine

## 2016-12-17 MED ORDER — SPIRONOLACTONE 25 MG PO TABS: 25.0000 mg | ORAL_TABLET | Freq: Every day | ORAL | 3 refills | Status: DC

## 2016-12-17 MED ORDER — LISINOPRIL 5 MG PO TABS: 5.0000 mg | ORAL_TABLET | Freq: Two times a day (BID) | ORAL | 3 refills | Status: DC

## 2016-12-17 NOTE — Telephone Encounter (Signed)
Pt's medication was sent to pt's pharmacy as requested. Confirmation received.  °

## 2016-12-21 LAB — CUP PACEART INCLINIC DEVICE CHECK
HIGH POWER IMPEDANCE MEASURED VALUE: 48 Ohm
HighPow Impedance: 63 Ohm
Implantable Lead Implant Date: 19970916
Implantable Lead Location: 753860
Implantable Lead Model: 125
Implantable Lead Model: 4269
Implantable Lead Serial Number: 222062
Implantable Lead Serial Number: 279972
Implantable Pulse Generator Implant Date: 20101020
Lead Channel Impedance Value: 386 Ohm
Lead Channel Impedance Value: 467 Ohm
Lead Channel Pacing Threshold Amplitude: 1.1 V
Lead Channel Pacing Threshold Pulse Width: 0.4 ms
Lead Channel Sensing Intrinsic Amplitude: 1.4 mV
Lead Channel Setting Pacing Amplitude: 2.5 V
Lead Channel Setting Pacing Amplitude: 5 V
Lead Channel Setting Pacing Pulse Width: 0.4 ms
MDC IDC LEAD IMPLANT DT: 19970916
MDC IDC LEAD LOCATION: 753859
MDC IDC MSMT LEADCHNL RA PACING THRESHOLD AMPLITUDE: 4 V
MDC IDC MSMT LEADCHNL RA PACING THRESHOLD PULSEWIDTH: 1 ms
MDC IDC MSMT LEADCHNL RV SENSING INTR AMPL: 19 mV
MDC IDC PG SERIAL: 149386
MDC IDC SESS DTM: 20180725040000
MDC IDC SET LEADCHNL RV SENSING SENSITIVITY: 0.6 mV

## 2017-01-31 ENCOUNTER — Ambulatory Visit (INDEPENDENT_AMBULATORY_CARE_PROVIDER_SITE_OTHER): Payer: Medicare Other | Admitting: *Deleted

## 2017-01-31 DIAGNOSIS — I422 Other hypertrophic cardiomyopathy: Secondary | ICD-10-CM

## 2017-01-31 NOTE — Progress Notes (Signed)
Remote ICD transmission.   

## 2017-02-02 ENCOUNTER — Encounter: Payer: Self-pay | Admitting: Cardiology

## 2017-02-02 LAB — CUP PACEART REMOTE DEVICE CHECK
Battery Remaining Longevity: 48 mo
Brady Statistic RV Percent Paced: 0 %
Date Time Interrogation Session: 20180910053100
HIGH POWER IMPEDANCE MEASURED VALUE: 59 Ohm
Implantable Lead Implant Date: 19970916
Implantable Lead Implant Date: 19970916
Implantable Lead Location: 753859
Implantable Lead Model: 4269
Implantable Lead Serial Number: 222062
Implantable Pulse Generator Implant Date: 20101020
Lead Channel Impedance Value: 449 Ohm
Lead Channel Pacing Threshold Amplitude: 1.1 V
Lead Channel Pacing Threshold Amplitude: 4 V
Lead Channel Pacing Threshold Pulse Width: 0.4 ms
Lead Channel Pacing Threshold Pulse Width: 1 ms
Lead Channel Setting Pacing Amplitude: 2.5 V
Lead Channel Setting Pacing Pulse Width: 0.4 ms
Lead Channel Setting Sensing Sensitivity: 0.6 mV
MDC IDC LEAD LOCATION: 753860
MDC IDC LEAD SERIAL: 279972
MDC IDC MSMT BATTERY REMAINING PERCENTAGE: 57 %
MDC IDC MSMT LEADCHNL RA IMPEDANCE VALUE: 367 Ohm
MDC IDC SET LEADCHNL RA PACING AMPLITUDE: 5 V
MDC IDC STAT BRADY RA PERCENT PACED: 1 %
Pulse Gen Serial Number: 149386

## 2017-05-02 ENCOUNTER — Ambulatory Visit (INDEPENDENT_AMBULATORY_CARE_PROVIDER_SITE_OTHER): Payer: Medicare Other | Admitting: *Deleted

## 2017-05-02 DIAGNOSIS — I422 Other hypertrophic cardiomyopathy: Secondary | ICD-10-CM | POA: Diagnosis not present

## 2017-05-02 NOTE — Progress Notes (Signed)
Remote ICD transmission.   

## 2017-05-06 ENCOUNTER — Encounter: Payer: Self-pay | Admitting: Cardiology

## 2017-05-07 LAB — CUP PACEART REMOTE DEVICE CHECK
Battery Remaining Longevity: 48 mo
Battery Remaining Percentage: 54 %
Brady Statistic RA Percent Paced: 1 %
Brady Statistic RV Percent Paced: 1 %
Date Time Interrogation Session: 20181210064400
HighPow Impedance: 61 Ohm
Implantable Lead Implant Date: 19970916
Implantable Lead Implant Date: 19970916
Implantable Lead Location: 753859
Implantable Lead Location: 753860
Implantable Lead Model: 125
Implantable Lead Model: 4269
Implantable Lead Serial Number: 222062
Implantable Lead Serial Number: 279972
Implantable Pulse Generator Implant Date: 20101020
Lead Channel Impedance Value: 388 Ohm
Lead Channel Impedance Value: 456 Ohm
Lead Channel Pacing Threshold Amplitude: 1.1 V
Lead Channel Pacing Threshold Amplitude: 4 V
Lead Channel Pacing Threshold Pulse Width: 0.4 ms
Lead Channel Pacing Threshold Pulse Width: 1 ms
Lead Channel Setting Pacing Amplitude: 2.5 V
Lead Channel Setting Pacing Amplitude: 5 V
Lead Channel Setting Pacing Pulse Width: 0.4 ms
Lead Channel Setting Sensing Sensitivity: 0.6 mV
Pulse Gen Serial Number: 149386

## 2017-05-09 ENCOUNTER — Other Ambulatory Visit: Payer: Self-pay | Admitting: Internal Medicine

## 2017-05-11 ENCOUNTER — Other Ambulatory Visit: Payer: Self-pay | Admitting: Internal Medicine

## 2017-05-11 NOTE — Telephone Encounter (Signed)
°*  STAT* If patient is at the pharmacy, call can be transferred to refill team.   1. Which medications need to be refilled? (please list name of each medication and dose if known) Warfarin 5mg   2. Which pharmacy/location (including street and city if local pharmacy) is medication to be sent to? [facility] [place]  3. Do they need a 30 day or 90 day supply? 30 DAY

## 2017-05-11 NOTE — Telephone Encounter (Signed)
Returned call to pt advised since we do not monitor or manage pt's Warfarin, we cannot refill this rx, even though it was initially written by Dr Graciela HusbandsKlein.  Spoke with pharmacy yesterday advised to [facility]/Dr Francene FindersSteven R Klein who is presently managing and monitoring pt's warfarin.  Advised pt to call their office as well to advise them she needs a refill.  Pt verbalized understanding.

## 2017-06-24 ENCOUNTER — Encounter: Payer: Self-pay | Admitting: *Deleted

## 2017-07-01 ENCOUNTER — Telehealth: Payer: Self-pay | Admitting: Internal Medicine

## 2017-07-01 NOTE — Telephone Encounter (Signed)
Teri, nurse manager with [facility], called me to ask for help clarifying which Dr. Sherryl MangesSteven Klein should be following Ms. Tolan's CVRBarth Kirks treatments.  Per the phone note from 10/22/16, Dr. Duke SalviaSteven C. Klein, cardiologist, did sign the orders.   I've spoken with Candance and Jasmine DecemberSharon in our [facility] clinic who have previously spoken with Barth Kirkseri today. They confirmed that there was some confusion and the best way to clear this is up is for faxes from [facility] to be faxed to [phone] ([facility]'s main line), not [phone] ([facility] Fax).  I've spoken with medical records who will try to distribute future faxes to Dr. Odessa FlemingKlein's box instead of routing them through our [facility] clinic. Note: medical records acknowledged that sometimes it's hard for them to determine where faxes should go but they will do their best.  ALL FAX RECORDS FOR MS. Doria FROM [facility] SHOULD GO TO DR. KLEIN, NOT THE [facility] CLINIC.  Routing to Prudence DavidsonKelley Auten, Pharm D., and Oretha MilchLorren Swanson, RN, as an [place] only.

## 2017-07-04 ENCOUNTER — Ambulatory Visit (INDEPENDENT_AMBULATORY_CARE_PROVIDER_SITE_OTHER): Payer: Medicare Other | Admitting: Internal Medicine

## 2017-07-04 ENCOUNTER — Encounter: Payer: Self-pay | Admitting: Internal Medicine

## 2017-07-04 VITALS — BP 116/74 | HR 69 | Ht 62.0 in | Wt 163.2 lb

## 2017-07-04 DIAGNOSIS — Z95810 Presence of automatic (implantable) cardiac defibrillator: Secondary | ICD-10-CM | POA: Diagnosis not present

## 2017-07-04 DIAGNOSIS — I421 Obstructive hypertrophic cardiomyopathy: Secondary | ICD-10-CM | POA: Diagnosis not present

## 2017-07-04 LAB — CUP PACEART INCLINIC DEVICE CHECK
Brady Statistic RV Percent Paced: 1 % — CL
Date Time Interrogation Session: 20190211050000
HIGH POWER IMPEDANCE MEASURED VALUE: 48 Ohm
HIGH POWER IMPEDANCE MEASURED VALUE: 63 Ohm
Implantable Lead Implant Date: 19970916
Implantable Lead Model: 125
Implantable Lead Model: 4269
Implantable Lead Serial Number: 222062
Implantable Lead Serial Number: 279972
Lead Channel Impedance Value: 393 Ohm
Lead Channel Impedance Value: 462 Ohm
Lead Channel Pacing Threshold Amplitude: 5 V
Lead Channel Sensing Intrinsic Amplitude: 1.8 mV
MDC IDC LEAD IMPLANT DT: 19970916
MDC IDC LEAD LOCATION: 753859
MDC IDC LEAD LOCATION: 753860
MDC IDC MSMT LEADCHNL RA PACING THRESHOLD PULSEWIDTH: 1 ms
MDC IDC MSMT LEADCHNL RV PACING THRESHOLD AMPLITUDE: 1.3 V
MDC IDC MSMT LEADCHNL RV PACING THRESHOLD PULSEWIDTH: 0.4 ms
MDC IDC MSMT LEADCHNL RV SENSING INTR AMPL: 19.4 mV
MDC IDC PG IMPLANT DT: 20101020
MDC IDC PG SERIAL: 149386
MDC IDC SET LEADCHNL RV PACING AMPLITUDE: 2.6 V
MDC IDC SET LEADCHNL RV PACING PULSEWIDTH: 0.4 ms
MDC IDC SET LEADCHNL RV SENSING SENSITIVITY: 0.6 mV

## 2017-07-04 MED ORDER — MAGNESIUM OXIDE 400 MG PO TABS: 400.0000 mg | ORAL_TABLET | Freq: Every day | ORAL | 3 refills | Status: DC

## 2017-07-04 NOTE — Patient Instructions (Addendum)
Medication Instructions:   Begin Magnesium Oxide, 400 mg tablet once per day.  Labwork: BMET  Testing/Procedures: None ordered.  Follow-Up: Your physician recommends that you schedule a follow-up appointment in: 6 months with Dr Graciela HusbandsKlein  Remote monitoring is used to monitor your Pacemaker from home. This monitoring reduces the number of office visits required to check your device to one time per year. It allows us to keep an eye on the functioning of your device to ensure it is working properly. You are scheduled for a device check from home on 08/02/2007. You may send your transmission at any time that day. If you have a wireless device, the transmission will be sent automatically. After your physician reviews your transmission, you will receive a postcard with your next transmission date.    Any Other Special Instructions Will Be Listed Below (If Applicable).  Decrease your salt intake.   Low-Sodium Eating Plan Sodium, which is an element that makes up salt, helps you maintain a healthy balance of fluids in your body. Too much sodium can increase your blood pressure and cause fluid and waste to be held in your body. Your health care provider or dietitian may recommend following this plan if you have high blood pressure (hypertension), kidney disease, liver disease, or heart failure. Eating less sodium can help lower your blood pressure, reduce swelling, and protect your heart, liver, and kidneys. What are tips for following this plan? General guidelines  Most people on this plan should limit their sodium intake to 1,500-2,000 mg (milligrams) of sodium each day. Reading food labels  The Nutrition Facts label lists the amount of sodium in one serving of the food. If you eat more than one serving, you must multiply the listed amount of sodium by the number of servings.  Choose foods with less than 140 mg of sodium per serving.  Avoid foods with 300 mg of sodium or more per  serving. Shopping  Look for lower-sodium products, often labeled as "low-sodium" or "no salt added."  Always check the sodium content even if foods are labeled as "unsalted" or "no salt added".  Buy fresh foods. ? Avoid canned foods and premade or frozen meals. ? Avoid canned, cured, or processed meats  Buy breads that have less than 80 mg of sodium per slice. Cooking  Eat more home-cooked food and less restaurant, buffet, and fast food.  Avoid adding salt when cooking. Use salt-free seasonings or herbs instead of table salt or sea salt. Check with your health care provider or pharmacist before using salt substitutes.  Cook with plant-based oils, such as canola, sunflower, or olive oil. Meal planning  When eating at a restaurant, ask that your food be prepared with less salt or no salt, if possible.  Avoid foods that contain MSG (monosodium glutamate). MSG is sometimes added to [place] food, bouillon, and some canned foods. What foods are recommended? The items listed may not be a complete list. Talk with your dietitian about what dietary choices are best for you. Grains Low-sodium cereals, including oats, puffed wheat and rice, and shredded wheat. Low-sodium crackers. Unsalted rice. Unsalted pasta. Low-sodium bread. Whole-grain breads and whole-grain pasta. Vegetables Fresh or frozen vegetables. "No salt added" canned vegetables. "No salt added" tomato sauce and paste. Low-sodium or reduced-sodium tomato and vegetable juice. Fruits Fresh, frozen, or canned fruit. Fruit juice. Meats and other protein foods Fresh or frozen (no salt added) meat, poultry, seafood, and fish. Low-sodium canned tuna and salmon. Unsalted nuts. Dried peas, beans,  and lentils without added salt. Unsalted canned beans. Eggs. Unsalted nut butters. Dairy Milk. Soy milk. Cheese that is naturally low in sodium, such as ricotta cheese, fresh mozzarella, or Swiss cheese Low-sodium or reduced-sodium cheese. Cream  cheese. Yogurt. Fats and oils Unsalted butter. Unsalted margarine with no trans fat. Vegetable oils such as canola or olive oils. Seasonings and other foods Fresh and dried herbs and spices. Salt-free seasonings. Low-sodium mustard and ketchup. Sodium-free salad dressing. Sodium-free light mayonnaise. Fresh or refrigerated horseradish. Lemon juice. Vinegar. Homemade, reduced-sodium, or low-sodium soups. Unsalted popcorn and pretzels. Low-salt or salt-free chips. What foods are not recommended? The items listed may not be a complete list. Talk with your dietitian about what dietary choices are best for you. Grains Instant hot cereals. Bread stuffing, pancake, and biscuit mixes. Croutons. Seasoned rice or pasta mixes. Noodle soup cups. Boxed or frozen macaroni and cheese. Regular salted crackers. Self-rising flour. Vegetables Sauerkraut, pickled vegetables, and relishes. Olives. [place] fries. Onion rings. Regular canned vegetables (not low-sodium or reduced-sodium). Regular canned tomato sauce and paste (not low-sodium or reduced-sodium). Regular tomato and vegetable juice (not low-sodium or reduced-sodium). Frozen vegetables in sauces. Meats and other protein foods Meat or fish that is salted, canned, smoked, spiced, or pickled. Bacon, ham, sausage, hotdogs, corned beef, chipped beef, packaged lunch meats, salt pork, jerky, pickled herring, anchovies, regular canned tuna, sardines, salted nuts. Dairy Processed cheese and cheese spreads. Cheese curds. Blue cheese. Feta cheese. String cheese. Regular cottage cheese. Buttermilk. Canned milk. Fats and oils Salted butter. Regular margarine. Ghee. Bacon fat. Seasonings and other foods Onion salt, garlic salt, seasoned salt, table salt, and sea salt. Canned and packaged gravies. Worcestershire sauce. Tartar sauce. Barbecue sauce. Teriyaki sauce. Soy sauce, including reduced-sodium. Steak sauce. Fish sauce. Oyster sauce. Cocktail sauce. Horseradish that you  find on the shelf. Regular ketchup and mustard. Meat flavorings and tenderizers. Bouillon cubes. Hot sauce and Tabasco sauce. Premade or packaged marinades. Premade or packaged taco seasonings. Relishes. Regular salad dressings. Salsa. Potato and tortilla chips. Corn chips and puffs. Salted popcorn and pretzels. Canned or dried soups. Pizza. Frozen entrees and pot pies. Summary  Eating less sodium can help lower your blood pressure, reduce swelling, and protect your heart, liver, and kidneys.  Most people on this plan should limit their sodium intake to 1,500-2,000 mg (milligrams) of sodium each day.  Canned, boxed, and frozen foods are high in sodium. Restaurant foods, fast foods, and pizza are also very high in sodium. You also get sodium by adding salt to food.  Try to cook at home, eat more fresh fruits and vegetables, and eat less fast food, canned, processed, or prepared foods. This information is not intended to replace advice given to you by your health care provider. Make sure you discuss any questions you have with your health care provider. Document Released: 10/30/2001 Document Revised: 05/03/2016 Document Reviewed: 05/03/2016 Elsevier Interactive Patient Education  [employer].      If you need a refill on your cardiac medications before your next appointment, please call your pharmacy.

## 2017-07-04 NOTE — Progress Notes (Signed)
Patient Care Team: System, Provider Not In as PCP - General   HPI  Aimee Edwards is a 72 y.o. female Seen in follow-up for hypertrophic cardiomyopathy status post septal myectomy. She has a history of ICD implantation. She has had interval atrial fibrillation resulting in inappropriate ICD discharges. She's been managed with anticoagulation.  She has had problems with atrial lead with over sensing  DATE TEST EF   4/18 Echo   45-50 % Severe>>See Below --Moderate          She has a history of mitral regurgitation with comments suggesting severe MR and moderate AI.  Have reviewed with PR >> mod-central  Medical management recommended   Continues to complain of dyspnea probably at 100 yards.  She has had some problems with swelling of legs but also of face.  She has had nocturnal cramps in her legs.  No chest pain no tachypalpitations     Past Medical History:  Diagnosis Date  . Atrial fibrillation (HCC)   . Back pain   . Cardiomyopathy, hypertrophic obstructive (HCC)   . Degeneration of cervical intervertebral disc   . Dyslipidemia   . Edema   . Gastroesophageal reflux disease   . Hernia, abdominal   . Hypokalemia   . ICD (implantable cardioverter-defibrillator) in place   . Mitral regurgitation   . Pinched nerve   . Reflux   . Right shoulder pain   . Shortness of breath on exertion   . Sleep apnea     Past Surgical History:  Procedure Laterality Date  . CARDIAC CATHETERIZATION    . COLONSCOPY    . CORONARY ARTERY BYPASS GRAFT    . ICD GENERATOR CHANGE    . MYOMECTOMY    . OTHER HEART/PERICARD OPS    . PACEMAKER INSERTION    . TONSILLECTOMY    . VAGINAL HYSTERECTOMY      Current Outpatient Medications  Medication Sig Dispense Refill  . atorvastatin (LIPITOR) 40 MG tablet Take 1 tablet (40 mg total) by mouth daily at 6 PM. 30 tablet 6  . furosemide (LASIX) 40 MG tablet Take 40 mg by mouth daily.    Marland Kitchen lisinopril (PRINIVIL,ZESTRIL) 5 MG tablet Take  1 tablet (5 mg total) by mouth 2 (two) times daily. 180 tablet 3  . metoprolol succinate (TOPROL-XL) 25 MG 24 hr tablet Take 1 tablet (25 mg total) by mouth daily. 30 tablet 6  . potassium chloride (K-DUR,KLOR-CON) 10 MEQ tablet Take 1 tablet (10 mEq total) by mouth daily. 90 tablet 3  . spironolactone (ALDACTONE) 25 MG tablet Take 1 tablet (25 mg total) by mouth daily. 90 tablet 3  . warfarin (COUMADIN) 5 MG tablet Take 1 tablet (5 mg) by mouth daily as directed by the coumadin clinic 30 tablet 2   No current facility-administered medications for this visit.     Allergies  Allergen Reactions  . Codeine Nausea And Vomiting  . 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 Indication  Atrial fibrillation-paroxysmal-recurrent with inappropriate ICD shocks  Congestive heart failure-progressive-class IIb  Mitral regurgitation/aortic regurgitation question severity moderate  Labile INR  Atrial lead failure  Edema  Leg cramps    No interval ventricular tachycardia  No interval atrial fibrillation of which we can be certain; atrial lead is over sensing and failed.  We have turned off.  We will [facility]  We will check a metabolic profile and magnesium with her leg cramps.  Rather than augment her diuretics, I have  suggested that she decrease her rather replete sodium intake so as to minimize the need for more aggressive diuresis  We will plan to reevaluate her LV function in about 6 months earlier if her dyspnea is worsening.  We spent more than 50% of our >25 min visit in face to face counseling regarding the above     Current medicines are reviewed at length with the patient today .  The patient does not  have concerns regarding medicines.

## 2017-07-05 LAB — BASIC METABOLIC PANEL
BUN/Creatinine Ratio: 18 (ref 12–28)
BUN: 14 mg/dL (ref 8–27)
CO2: 24 mmol/L (ref 20–29)
CREATININE: 0.8 mg/dL (ref 0.57–1.00)
Calcium: 9.6 mg/dL (ref 8.7–10.3)
Chloride: 104 mmol/L (ref 96–106)
GFR calc Af Amer: 85 mL/min/{1.73_m2} (ref >59)
GFR, EST NON AFRICAN AMERICAN: 74 mL/min/{1.73_m2} (ref >59)
Glucose: 133 mg/dL — ABNORMAL HIGH (ref 65–99)
Potassium: 3.6 mmol/L (ref 3.5–5.2)
SODIUM: 145 mmol/L — AB (ref 134–144)

## 2017-08-01 ENCOUNTER — Ambulatory Visit (INDEPENDENT_AMBULATORY_CARE_PROVIDER_SITE_OTHER): Payer: Medicare Other | Admitting: *Deleted

## 2017-08-01 DIAGNOSIS — I421 Obstructive hypertrophic cardiomyopathy: Secondary | ICD-10-CM | POA: Diagnosis not present

## 2017-08-01 NOTE — Progress Notes (Signed)
Remote ICD transmission.   

## 2017-08-03 ENCOUNTER — Encounter: Payer: Self-pay | Admitting: Cardiology

## 2017-08-20 LAB — CUP PACEART REMOTE DEVICE CHECK
Battery Remaining Longevity: 48 mo
Brady Statistic RA Percent Paced: 0 %
Brady Statistic RV Percent Paced: 1 %
Date Time Interrogation Session: 20190311045100
HIGH POWER IMPEDANCE MEASURED VALUE: 63 Ohm
Implantable Lead Implant Date: 19970916
Implantable Lead Location: 753860
Implantable Lead Model: 4269
Implantable Lead Serial Number: 279972
Lead Channel Impedance Value: 459 Ohm
Lead Channel Pacing Threshold Amplitude: 1.3 V
Lead Channel Pacing Threshold Amplitude: 5 V
Lead Channel Pacing Threshold Pulse Width: 1 ms
Lead Channel Setting Pacing Amplitude: 2.6 V
Lead Channel Setting Pacing Pulse Width: 0.4 ms
MDC IDC LEAD IMPLANT DT: 19970916
MDC IDC LEAD LOCATION: 753859
MDC IDC LEAD SERIAL: 222062
MDC IDC MSMT BATTERY REMAINING PERCENTAGE: 55 %
MDC IDC MSMT LEADCHNL RV PACING THRESHOLD PULSEWIDTH: 0.4 ms
MDC IDC PG IMPLANT DT: 20101020
MDC IDC PG SERIAL: 149386
MDC IDC SET LEADCHNL RV SENSING SENSITIVITY: 0.6 mV

## 2017-10-10 ENCOUNTER — Other Ambulatory Visit: Payer: Self-pay | Admitting: Internal Medicine

## 2017-10-10 MED ORDER — METOPROLOL SUCCINATE ER 25 MG PO TB24: 25.0000 mg | ORAL_TABLET | Freq: Every day | ORAL | 1 refills | Status: DC

## 2017-10-31 ENCOUNTER — Ambulatory Visit (INDEPENDENT_AMBULATORY_CARE_PROVIDER_SITE_OTHER): Payer: Medicare Other | Admitting: *Deleted

## 2017-10-31 ENCOUNTER — Telehealth: Payer: Self-pay

## 2017-10-31 DIAGNOSIS — I422 Other hypertrophic cardiomyopathy: Secondary | ICD-10-CM

## 2017-10-31 NOTE — Telephone Encounter (Signed)
I spoke with the patient about this.

## 2017-11-01 ENCOUNTER — Encounter: Payer: Self-pay | Admitting: Cardiology

## 2017-11-01 NOTE — Progress Notes (Signed)
Remote ICD transmission.   

## 2017-11-25 LAB — CUP PACEART REMOTE DEVICE CHECK
Battery Remaining Longevity: 42 mo
Brady Statistic RV Percent Paced: 1 %
HighPow Impedance: 61 Ohm
Implantable Lead Implant Date: 19970916
Implantable Lead Location: 753859
Implantable Lead Model: 125
Implantable Lead Model: 4269
Lead Channel Pacing Threshold Amplitude: 1.3 V
Lead Channel Pacing Threshold Amplitude: 5 V
Lead Channel Setting Pacing Amplitude: 2.6 V
Lead Channel Setting Pacing Pulse Width: 0.4 ms
Lead Channel Setting Sensing Sensitivity: 0.6 mV
MDC IDC LEAD IMPLANT DT: 19970916
MDC IDC LEAD LOCATION: 753860
MDC IDC LEAD SERIAL: 222062
MDC IDC LEAD SERIAL: 279972
MDC IDC MSMT BATTERY REMAINING PERCENTAGE: 53 %
MDC IDC MSMT LEADCHNL RA PACING THRESHOLD PULSEWIDTH: 1 ms
MDC IDC MSMT LEADCHNL RV IMPEDANCE VALUE: 463 Ohm
MDC IDC MSMT LEADCHNL RV PACING THRESHOLD PULSEWIDTH: 0.4 ms
MDC IDC PG IMPLANT DT: 20101020
MDC IDC PG SERIAL: 149386
MDC IDC SESS DTM: 20190611023500
MDC IDC STAT BRADY RA PERCENT PACED: 0 %

## 2018-01-02 ENCOUNTER — Encounter: Payer: Self-pay | Admitting: Internal Medicine

## 2018-01-03 ENCOUNTER — Ambulatory Visit (INDEPENDENT_AMBULATORY_CARE_PROVIDER_SITE_OTHER): Payer: Medicare Other | Admitting: Internal Medicine

## 2018-01-03 VITALS — BP 118/68 | Resp 14 | Ht 62.0 in | Wt 159.4 lb

## 2018-01-03 DIAGNOSIS — Z95810 Presence of automatic (implantable) cardiac defibrillator: Secondary | ICD-10-CM

## 2018-01-03 DIAGNOSIS — I480 Paroxysmal atrial fibrillation: Secondary | ICD-10-CM

## 2018-01-03 DIAGNOSIS — I422 Other hypertrophic cardiomyopathy: Secondary | ICD-10-CM | POA: Diagnosis not present

## 2018-01-03 MED ORDER — ATORVASTATIN CALCIUM 40 MG PO TABS: 40.0000 mg | ORAL_TABLET | Freq: Every day | ORAL | 3 refills | Status: DC

## 2018-01-03 NOTE — Patient Instructions (Signed)
Medication Instructions:  Your physician recommends that you continue on your current medications as directed. Please refer to the Current Medication list given to you today.  Labwork: None ordered.  Testing/Procedures: None ordered.  Follow-Up: Your physician wants you to follow-up in: One Year with Dr Graciela HusbandsKlein. You will receive a reminder letter in the mail two months in advance. If you don't receive a letter, please call our office to schedule the follow-up appointment.  Remote monitoring is used to monitor your Pacemaker of ICD from home. This monitoring reduces the number of office visits required to check your device to one time per year. It allows us to keep an eye on the functioning of your device to ensure it is working properly. You are scheduled for a device check from home on 9/9. You may send your transmission at any time that day. If you have a wireless device, the transmission will be sent automatically. After your physician reviews your transmission, you will receive a postcard with your next transmission date.    Any Other Special Instructions Will Be Listed Below (If Applicable).     If you need a refill on your cardiac medications before your next appointment, please call your pharmacy.

## 2018-01-03 NOTE — Progress Notes (Signed)
Patient Care Team: System, Provider Not In as PCP - General   HPI  Aimee Edwards is a 72 y.o. female Seen in follow-up for hypertrophic cardiomyopathy status post septal myectomy. She has a history of ICD implantation. She has had interval atrial fibrillation resulting in inappropriate ICD discharges. She's been managed with anticoagulation.  She has had problems with atrial lead with over sensing  DATE TEST EF   4/18 Echo   45-50 % Severe>>See Below --Moderate          She has a history of mitral regurgitation with comments suggesting severe MR and moderate AI.  Have reviewed with PR >> mod-central  Medical management recommended   Continues to complain of dyspnea probably at 100 yards.  She has had some problems with swelling of legs but also of face.  She has had nocturnal cramps in her legs.  No chest pain no tachypalpitations     Past Medical History:  Diagnosis Date  . Atrial fibrillation (HCC)   . Back pain   . Cardiomyopathy, hypertrophic obstructive (HCC)   . Degeneration of cervical intervertebral disc   . Dyslipidemia   . Edema   . Gastroesophageal reflux disease   . Hernia, abdominal   . Hypokalemia   . ICD (implantable cardioverter-defibrillator) in place   . Mitral regurgitation   . Pinched nerve   . Reflux   . Right shoulder pain   . Shortness of breath on exertion   . Sleep apnea     Past Surgical History:  Procedure Laterality Date  . CARDIAC CATHETERIZATION    . COLONSCOPY    . CORONARY ARTERY BYPASS GRAFT    . ICD GENERATOR CHANGE    . MYOMECTOMY    . OTHER HEART/PERICARD OPS    . PACEMAKER INSERTION    . TONSILLECTOMY    . VAGINAL HYSTERECTOMY      Current Outpatient Medications  Medication Sig Dispense Refill  . atorvastatin (LIPITOR) 40 MG tablet Take 1 tablet (40 mg total) by mouth daily at 6 PM. 90 tablet 3  . furosemide (LASIX) 40 MG tablet Take 40 mg by mouth daily.    Marland Kitchen. lisinopril (PRINIVIL,ZESTRIL) 5 MG tablet Take  1 tablet (5 mg total) by mouth 2 (two) times daily. 180 tablet 3  . magnesium oxide (MAG-OX) 400 MG tablet Take 1 tablet (400 mg total) by mouth daily. 90 tablet 3  . metoprolol succinate (TOPROL-XL) 25 MG 24 hr tablet Take 1 tablet (25 mg total) by mouth daily. 90 tablet 1  . potassium chloride (K-DUR) 10 MEQ tablet TAKE ONE TABLET BY MOUTH DAILY 90 tablet 1  . spironolactone (ALDACTONE) 25 MG tablet Take 1 tablet (25 mg total) by mouth daily. 90 tablet 3  . warfarin (COUMADIN) 5 MG tablet Take 1 tablet (5 mg) by mouth daily as directed by the coumadin clinic 30 tablet 2   No current facility-administered medications for this visit.     Allergies  Allergen Reactions  . Codeine Nausea And Vomiting  . 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 Indication  Atrial fibrillation-paroxysmal-recurrent with inappropriate ICD shocks  Congestive heart failure-progressive-class IIb  Mitral regurgitation/aortic regurgitation question severity moderate  Labile INR  Atrial lead failure  Edema  Leg cramps    No interval ventricular tachycardia  No interval atrial fibrillation of which we can be certain; atrial lead is over sensing and failed.  We have turned off.  We will [facility]  We will check a metabolic profile and  magnesium with her leg cramps.  Rather than augment her diuretics, I have suggested that she decrease her rather replete sodium intake so as to minimize the need for more aggressive diuresis  We will plan to reevaluate her LV function in about 6 months earlier if her dyspnea is worsening.  We spent more than 50% of our >25 min visit in face to face counseling regarding the above     Current medicines are reviewed at length with the patient today .  The patient does not  have concerns regarding medicines.

## 2018-01-04 LAB — MAGNESIUM: Magnesium: 2.1 mg/dL (ref 1.6–2.3)

## 2018-01-04 LAB — CBC
HEMATOCRIT: 35.9 % (ref 34.0–46.6)
Hemoglobin: 12.3 g/dL (ref 11.1–15.9)
MCH: 31.9 pg (ref 26.6–33.0)
MCHC: 34.3 g/dL (ref 31.5–35.7)
MCV: 93 fL (ref 79–97)
PLATELETS: 270 10*3/uL (ref 150–450)
RBC: 3.86 x10E6/uL (ref 3.77–5.28)
RDW: 12.9 % (ref 12.3–15.4)
WBC: 5.8 10*3/uL (ref 3.4–10.8)

## 2018-01-04 LAB — HEPATIC FUNCTION PANEL
ALK PHOS: 116 IU/L (ref 39–117)
ALT: 17 IU/L (ref 0–32)
AST: 21 IU/L (ref 0–40)
Albumin: 4.2 g/dL (ref 3.5–4.8)
BILIRUBIN, DIRECT: 0.08 mg/dL (ref 0.00–0.40)
Bilirubin Total: 0.3 mg/dL (ref 0.0–1.2)
Total Protein: 6.7 g/dL (ref 6.0–8.5)

## 2018-01-04 LAB — BASIC METABOLIC PANEL
BUN / CREAT RATIO: 25 (ref 12–28)
BUN: 19 mg/dL (ref 8–27)
CO2: 25 mmol/L (ref 20–29)
CREATININE: 0.75 mg/dL (ref 0.57–1.00)
Calcium: 9.5 mg/dL (ref 8.7–10.3)
Chloride: 103 mmol/L (ref 96–106)
GFR calc Af Amer: 92 mL/min/{1.73_m2} (ref >59)
GFR, EST NON AFRICAN AMERICAN: 80 mL/min/{1.73_m2} (ref >59)
Glucose: 91 mg/dL (ref 65–99)
Potassium: 4.3 mmol/L (ref 3.5–5.2)
SODIUM: 140 mmol/L (ref 134–144)

## 2018-01-09 NOTE — Addendum Note (Signed)
Addended by: Solon AugustaBOYD, Nan Maya H on: 01/09/2018 09:19 AM   Modules accepted: Orders

## 2018-01-10 ENCOUNTER — Other Ambulatory Visit: Payer: Self-pay | Admitting: Internal Medicine

## 2018-01-30 ENCOUNTER — Encounter: Payer: Self-pay | Admitting: Cardiology

## 2018-01-30 ENCOUNTER — Ambulatory Visit (INDEPENDENT_AMBULATORY_CARE_PROVIDER_SITE_OTHER): Payer: Medicare Other | Admitting: *Deleted

## 2018-01-30 DIAGNOSIS — I422 Other hypertrophic cardiomyopathy: Secondary | ICD-10-CM | POA: Diagnosis not present

## 2018-01-30 NOTE — Progress Notes (Signed)
Remote ICD transmission.   

## 2018-02-02 ENCOUNTER — Other Ambulatory Visit: Payer: Self-pay | Admitting: Internal Medicine

## 2018-02-02 MED ORDER — SPIRONOLACTONE 25 MG PO TABS: 25.0000 mg | ORAL_TABLET | Freq: Every day | ORAL | 3 refills | Status: DC

## 2018-02-02 MED ORDER — LISINOPRIL 5 MG PO TABS: 5.0000 mg | ORAL_TABLET | Freq: Two times a day (BID) | ORAL | 3 refills | Status: DC

## 2018-02-21 LAB — CUP PACEART REMOTE DEVICE CHECK
Battery Remaining Percentage: 50 %
Date Time Interrogation Session: 20190909054200
HighPow Impedance: 64 Ohm
Implantable Lead Implant Date: 19970916
Implantable Lead Implant Date: 19970916
Implantable Lead Location: 753859
Implantable Lead Location: 753860
Implantable Lead Model: 125
Implantable Lead Serial Number: 222062
Implantable Pulse Generator Implant Date: 20101020
Lead Channel Impedance Value: 470 Ohm
Lead Channel Pacing Threshold Pulse Width: 0.4 ms
Lead Channel Setting Pacing Pulse Width: 0.4 ms
MDC IDC LEAD SERIAL: 279972
MDC IDC MSMT BATTERY REMAINING LONGEVITY: 42 mo
MDC IDC MSMT LEADCHNL RA PACING THRESHOLD AMPLITUDE: 5 V
MDC IDC MSMT LEADCHNL RA PACING THRESHOLD PULSEWIDTH: 1 ms
MDC IDC MSMT LEADCHNL RV PACING THRESHOLD AMPLITUDE: 1 V
MDC IDC SET LEADCHNL RV PACING AMPLITUDE: 2.6 V
MDC IDC SET LEADCHNL RV SENSING SENSITIVITY: 0.6 mV
MDC IDC STAT BRADY RA PERCENT PACED: 0 %
MDC IDC STAT BRADY RV PERCENT PACED: 0 %
Pulse Gen Serial Number: 149386

## 2018-03-23 NOTE — Telephone Encounter (Signed)
Attempted Home phone, line was busy.

## 2018-03-23 NOTE — Telephone Encounter (Signed)
Left the patient a voicemail to call the office.

## 2018-03-24 NOTE — Telephone Encounter (Signed)
LVM on cell phone for pt to send in a transmission direct number to device clinic given, home phone was busy

## 2018-03-30 NOTE — Telephone Encounter (Signed)
Remote transmission reviewed. Presenting rhythm: AsVs. 0%Ap,1%Vp. No episodes recorded. Stable histograms. Stable lead measurements. Stable device function.  Called patient regarding recent sx's. Patient states that she has had 4 incidents where she gets a heavy feeling across both shoulders and arms, and she feels as though she will pass out. Patient states that she had 3 incidents on 10/31, and 1 incident on 11/6. Patient denies any CP or other sx's associated with it. Patient states that her BP has been "OK", but she was unable to provide specific values. Patient denies any sx's today. Patient states that she was once told that she may have vertigo. I told patient that her remote transmission was stable, but I did review MI sx's with patient, and we also discussed ER precautions. Patient states that she has a f/u with her PCP on 11/15. I encouraged patient to keep her f/u w/her PCP, but to notify us if he believes she needs to f/u with Dr.Klein. Patient verbalized understanding.

## 2018-04-07 ENCOUNTER — Other Ambulatory Visit: Payer: Self-pay | Admitting: Internal Medicine

## 2018-05-01 ENCOUNTER — Ambulatory Visit (INDEPENDENT_AMBULATORY_CARE_PROVIDER_SITE_OTHER): Payer: Medicare Other

## 2018-05-01 DIAGNOSIS — I422 Other hypertrophic cardiomyopathy: Secondary | ICD-10-CM

## 2018-05-02 NOTE — Progress Notes (Signed)
Remote ICD transmission.   

## 2018-06-06 ENCOUNTER — Ambulatory Visit (INDEPENDENT_AMBULATORY_CARE_PROVIDER_SITE_OTHER): Payer: Medicare Other | Admitting: Internal Medicine

## 2018-06-06 ENCOUNTER — Encounter: Payer: Self-pay | Admitting: Internal Medicine

## 2018-06-06 VITALS — BP 99/65 | HR 68 | Ht 62.0 in | Wt 158.0 lb

## 2018-06-06 DIAGNOSIS — Z95810 Presence of automatic (implantable) cardiac defibrillator: Secondary | ICD-10-CM | POA: Diagnosis not present

## 2018-06-06 DIAGNOSIS — G43919 Migraine, unspecified, intractable, without status migrainosus: Secondary | ICD-10-CM

## 2018-06-06 DIAGNOSIS — I480 Paroxysmal atrial fibrillation: Secondary | ICD-10-CM | POA: Diagnosis not present

## 2018-06-06 DIAGNOSIS — I421 Obstructive hypertrophic cardiomyopathy: Secondary | ICD-10-CM | POA: Diagnosis not present

## 2018-06-06 DIAGNOSIS — I509 Heart failure, unspecified: Secondary | ICD-10-CM | POA: Diagnosis not present

## 2018-06-06 MED ORDER — LISINOPRIL 5 MG PO TABS: 5.0000 mg | ORAL_TABLET | Freq: Every day | ORAL | 3 refills | Status: DC

## 2018-06-06 MED ORDER — SPIRONOLACTONE 25 MG PO TABS: 12.5000 mg | ORAL_TABLET | Freq: Every day | ORAL | 3 refills | Status: DC

## 2018-06-06 NOTE — Progress Notes (Signed)
Patient Care Team: [name], [name], MD as PCP - General (Family Medicine)   HPI  Aimee Edwards is a 73 y.o. female Seen in follow-up for hypertrophic cardiomyopathy status post septal myectomy. She has a history of ICD implantation. She has had interval atrial fibrillation resulting in inappropriate ICD discharges. She's been managed with anticoagulation.  She has had problems with atrial lead with over sensing  DATE TEST EF   4/18 Echo   45-50 % Severe>>See Below --Moderate  11/19 Echo  60-65% Severe LAE     She has a history of mitral regurgitation with comments suggesting severe MR and moderate AI.  Have reviewed with PR >> mod-central  Medical management recommended   She has had spells of lightheadedness followed by nausea and vomiting.  She has been told she was dehydrated.  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  She was started on aspirin and Plavix in conjunction with her Coumadin.  The Plavix has been intercurrently discontinued.  She has had one episode of shoulder discomfort but no more episodes of the nausea and lightheadedness.  Blood pressures have been recorded in the low 100s and high 90s.  Chronic stable shortness of breath.  Has a history of migraines   Past Medical History:  Diagnosis Date  . Atrial fibrillation (HCC)   . Back pain   . Cardiomyopathy, hypertrophic obstructive (HCC)   . Degeneration of cervical intervertebral disc   . Dyslipidemia   . Edema   . Gastroesophageal reflux disease   . Hernia, abdominal   . Hypokalemia   . ICD (implantable cardioverter-defibrillator) in place   . Mitral regurgitation   . Pinched nerve   . Reflux   . Right shoulder pain   . Shortness of breath on exertion   . Sleep apnea     Past Surgical History:  Procedure Laterality Date  . CARDIAC CATHETERIZATION    . COLONSCOPY    . CORONARY ARTERY BYPASS GRAFT    . ICD GENERATOR CHANGE    .  MYOMECTOMY    . OTHER HEART/PERICARD OPS    . PACEMAKER INSERTION    . TONSILLECTOMY    . VAGINAL HYSTERECTOMY      Current Outpatient Medications  Medication Sig Dispense Refill  . aspirin EC 81 MG tablet Take 81 mg by mouth daily.    Marland Kitchen. atorvastatin (LIPITOR) 40 MG tablet Take 1 tablet (40 mg total) by mouth daily at 6 PM. 90 tablet 3  . furosemide (LASIX) 40 MG tablet TAKE ONE TABLET BY MOUTH DAILY 30 tablet 11  . lisinopril (PRINIVIL,ZESTRIL) 5 MG tablet Take 1 tablet (5 mg total) by mouth 2 (two) times daily. 180 tablet 3  . magnesium oxide (MAG-OX) 400 MG tablet Take 1 tablet (400 mg total) by mouth daily. 90 tablet 3  . metoprolol succinate (TOPROL-XL) 25 MG 24 hr tablet Take 1 tablet (25 mg total) by mouth daily. 90 tablet 1  . potassium chloride (K-DUR) 10 MEQ tablet TAKE ONE TABLET BY MOUTH DAILY 90 tablet 2  . spironolactone (ALDACTONE) 25 MG tablet Take 1 tablet (25 mg total) by mouth daily. 90 tablet 3  . warfarin (COUMADIN) 5 MG tablet Take 1 tablet (5 mg) by mouth daily as directed by the coumadin clinic 30 tablet 2   No current facility-administered medications for this visit.     Allergies  Allergen Reactions  . Codeine Nausea And Vomiting  .  Lortab [Hydrocodone-Acetaminophen] Nausea And Vomiting      Review of Systems negative except from HPI and PMH  Physical Exam BP 99/65   Pulse 68   Ht 5\' 2"  (1.575 m)   Wt 158 lb (71.7 kg)   SpO2 98%   BMI 28.90 kg/m  Well developed and nourished in no acute distress HENT normal Neck supple with JVP-flat Clear Regular rate and rhythm, no murmurs or gallops Abd-soft with active BS No Clubbing cyanosis edema Skin-warm and dry A & Oriented  Grossly normal sensory and motor function Acephalic migraines    ECG sinus at 68 Intervals 18/14/43 LVH with repolarization abnormalities  Outside records reviewed as noted above   Assessment and  Plan   Hypertrophic obstructive cardiomyopathy status post septal  myectomy 1996  Implantable defibrillator 1996? Indication  The patient's device was interrogated.  The information was reviewed. No changes were made in the programming.     Atrial fibrillation-paroxysmal-recurrent with inappropriate ICD shocks  Congestive heart failure-progressive-class IIb  Mitral regurgitation/aortic regurgitation question severity moderate  Labile INR  Atrial lead failure  Edema     With her episodes of lightheadedness and relatively low blood pressure we will decrease her lisinopril as well as decrease her Aldactone.  I wonder whether her episodes are neurological in origin especially with her antecedent history of migraine headaches.  They could be neurally mediated via the autonomic's but this seems less likely.  We spent more than 50% of our >25 min visit in face to face counseling regarding the above    Current medicines are reviewed at length with the patient today .  The patient does not  have concerns regarding medicines.

## 2018-06-06 NOTE — Patient Instructions (Addendum)
Medication Instructions:  Your physician has recommended you make the following change in your medication:   Decrease your Lisinopril to 5mg , one tablet, once per day Decrease you Spironolactone to 12.5mg , half tablet, once per day  Labwork: None ordered.  Testing/Procedures: None ordered.  Follow-Up: Your physician recommends that you schedule a follow-up appointment in:  One Year with Dr Graciela HusbandsKlein  We have sent in a referral to [facility] for evaluation of your migraines.    Remote monitoring is used to monitor your Pacemaker of ICD from home. This monitoring reduces the number of office visits required to check your device to one time per year. It allows us to keep an eye on the functioning of your device to ensure it is working properly. You are scheduled for a device check from home on 3/9. You may send your transmission at any time that day. If you have a wireless device, the transmission will be sent automatically. After your physician reviews your transmission, you will receive a postcard with your next transmission date.     Any Other Special Instructions Will Be Listed Below (If Applicable).     If you need a refill on your cardiac medications before your next appointment, please call your pharmacy.

## 2018-06-07 LAB — CUP PACEART INCLINIC DEVICE CHECK
Date Time Interrogation Session: 20200109050000
HighPow Impedance: 38 Ohm
HighPow Impedance: 51 Ohm
Implantable Lead Implant Date: 19970916
Implantable Lead Implant Date: 19970916
Implantable Lead Location: 753859
Implantable Lead Location: 753860
Implantable Lead Model: 4269
Implantable Lead Serial Number: 222062
Implantable Lead Serial Number: 279972
Implantable Pulse Generator Implant Date: 20101020
Lead Channel Impedance Value: 935 Ohm
Lead Channel Pacing Threshold Amplitude: 0.7 V
Lead Channel Pacing Threshold Amplitude: 1 V
Lead Channel Pacing Threshold Amplitude: 1.9 V
Lead Channel Pacing Threshold Pulse Width: 0.3 ms
Lead Channel Pacing Threshold Pulse Width: 0.4 ms
Lead Channel Pacing Threshold Pulse Width: 1 ms
Lead Channel Sensing Intrinsic Amplitude: 4.4 mV
Lead Channel Setting Pacing Amplitude: 2 V
Lead Channel Setting Pacing Amplitude: 2.5 V
Lead Channel Setting Pacing Amplitude: 2.5 V
Lead Channel Setting Pacing Pulse Width: 0.4 ms
Lead Channel Setting Pacing Pulse Width: 1 ms
Lead Channel Setting Sensing Sensitivity: 0.5 mV
Lead Channel Setting Sensing Sensitivity: 1 mV
MDC IDC MSMT LEADCHNL RA IMPEDANCE VALUE: 565 Ohm
MDC IDC MSMT LEADCHNL RV IMPEDANCE VALUE: 621 Ohm
MDC IDC PG SERIAL: 492371

## 2018-06-15 LAB — CUP PACEART REMOTE DEVICE CHECK
Battery Remaining Longevity: 36 mo
Battery Remaining Percentage: 45 %
Brady Statistic RA Percent Paced: 0 %
Brady Statistic RV Percent Paced: 1 %
Date Time Interrogation Session: 20191209072200
HighPow Impedance: 61 Ohm
Implantable Lead Implant Date: 19970916
Implantable Lead Implant Date: 19970916
Implantable Lead Location: 753859
Implantable Lead Location: 753860
Implantable Lead Model: 125
Implantable Lead Model: 4269
Implantable Lead Serial Number: 222062
Implantable Lead Serial Number: 279972
Implantable Pulse Generator Implant Date: 20101020
Lead Channel Impedance Value: 462 Ohm
Lead Channel Pacing Threshold Amplitude: 1 V
Lead Channel Pacing Threshold Pulse Width: 0.4 ms
Lead Channel Pacing Threshold Pulse Width: 1 ms
Lead Channel Setting Pacing Amplitude: 2.6 V
Lead Channel Setting Pacing Pulse Width: 0.4 ms
Lead Channel Setting Sensing Sensitivity: 0.6 mV
MDC IDC MSMT LEADCHNL RA PACING THRESHOLD AMPLITUDE: 5 V
Pulse Gen Serial Number: 492371

## 2018-06-20 MED ORDER — ONDANSETRON 4 MG PO TBDP
4.00 | ORAL_TABLET | ORAL | Status: DC
Start: ? — End: 2018-06-20

## 2018-06-20 MED ORDER — METOPROLOL SUCCINATE ER 25 MG PO TB24
25.00 | ORAL_TABLET | ORAL | Status: DC
Start: 2018-06-21 — End: 2018-06-20

## 2018-06-20 MED ORDER — ATORVASTATIN CALCIUM 20 MG PO TABS
40.00 | ORAL_TABLET | ORAL | Status: DC
Start: 2018-06-20 — End: 2018-06-20

## 2018-06-20 MED ORDER — SODIUM CHLORIDE 0.9 % IV SOLN
100.00 | INTRAVENOUS | Status: DC
Start: ? — End: 2018-06-20

## 2018-06-20 MED ORDER — SPIRONOLACTONE 25 MG PO TABS
25.00 | ORAL_TABLET | ORAL | Status: DC
Start: 2018-06-21 — End: 2018-06-20

## 2018-06-20 MED ORDER — ASPIRIN EC 81 MG PO TBEC
81.00 | DELAYED_RELEASE_TABLET | ORAL | Status: DC
Start: 2018-06-21 — End: 2018-06-20

## 2018-06-20 MED ORDER — AMIODARONE HCL 200 MG PO TABS
400.00 | ORAL_TABLET | ORAL | Status: DC
Start: 2018-06-20 — End: 2018-06-20

## 2018-06-20 MED ORDER — LISINOPRIL 5 MG PO TABS
5.00 | ORAL_TABLET | ORAL | Status: DC
Start: 2018-06-20 — End: 2018-06-20

## 2018-06-21 ENCOUNTER — Telehealth: Payer: Self-pay | Admitting: Internal Medicine

## 2018-06-21 NOTE — Telephone Encounter (Signed)
Pt c/o medication issue:  1. Name of Medication: spironolactone (ALDACTONE) lisinopril (PRINIVIL,ZESTRIL)  Amiodarone  2. How are you currently taking this medication (dosage and times per day)? --  3. Are you having a reaction (difficulty breathing--STAT)? no  4. What is your medication issue? Patient states she was recently in the hospital but they didn't really give her any instruction on taking the medication. She wants to know how low her BP can be and she can still take her BP medications. She said "example if her BP is 110/68, can she take her meds"

## 2018-06-21 NOTE — Telephone Encounter (Signed)
Spoke with the patient, she was in the hospital the other day due to her device firing. She state that she has had low blood pressure and was not able to take her blood pressure medications due to hypotension. The patient stated this morning her pressure was 110/68 and she asked if she should take her medication. I advised she hold her lisinopril and to see what Dr. Graciela Husbands says about dosing. The patient also started Pacerone 400 mg, BID for 9 days, then 400 mg, daily after since her hospitalization. Sending to Dr. Graciela Husbands.

## 2018-06-22 ENCOUNTER — Other Ambulatory Visit: Payer: Self-pay

## 2018-06-22 ENCOUNTER — Inpatient Hospital Stay (HOSPITAL_COMMUNITY)
Admission: AD | Admit: 2018-06-22 | Discharge: 2018-06-27 | DRG: 308 | Disposition: A | Discharge status: Home / Self Care | Payer: Medicare Other | Source: Ambulatory Visit | Attending: Internal Medicine | Admitting: Internal Medicine

## 2018-06-22 ENCOUNTER — Encounter (HOSPITAL_COMMUNITY): Payer: Self-pay | Admitting: General Practice

## 2018-06-22 ENCOUNTER — Encounter: Payer: Self-pay | Admitting: Internal Medicine

## 2018-06-22 ENCOUNTER — Ambulatory Visit (INDEPENDENT_AMBULATORY_CARE_PROVIDER_SITE_OTHER): Payer: Medicare Other | Admitting: Internal Medicine

## 2018-06-22 VITALS — BP 144/80 | HR 73 | Ht 62.0 in | Wt 154.2 lb

## 2018-06-22 DIAGNOSIS — Z885 Allergy status to narcotic agent status: Secondary | ICD-10-CM | POA: Diagnosis not present

## 2018-06-22 DIAGNOSIS — Z95810 Presence of automatic (implantable) cardiac defibrillator: Secondary | ICD-10-CM | POA: Diagnosis not present

## 2018-06-22 DIAGNOSIS — I959 Hypotension, unspecified: Secondary | ICD-10-CM | POA: Diagnosis not present

## 2018-06-22 DIAGNOSIS — I421 Obstructive hypertrophic cardiomyopathy: Secondary | ICD-10-CM | POA: Diagnosis not present

## 2018-06-22 DIAGNOSIS — I472 Ventricular tachycardia, unspecified: Secondary | ICD-10-CM

## 2018-06-22 DIAGNOSIS — I361 Nonrheumatic tricuspid (valve) insufficiency: Secondary | ICD-10-CM | POA: Diagnosis not present

## 2018-06-22 DIAGNOSIS — Y831 Surgical operation with implant of artificial internal device as the cause of abnormal reaction of the patient, or of later complication, without mention of misadventure at the time of the procedure: Secondary | ICD-10-CM | POA: Diagnosis present

## 2018-06-22 DIAGNOSIS — I340 Nonrheumatic mitral (valve) insufficiency: Secondary | ICD-10-CM | POA: Diagnosis not present

## 2018-06-22 DIAGNOSIS — Y712 Prosthetic and other implants, materials and accessory cardiovascular devices associated with adverse incidents: Secondary | ICD-10-CM | POA: Diagnosis present

## 2018-06-22 DIAGNOSIS — I480 Paroxysmal atrial fibrillation: Secondary | ICD-10-CM | POA: Diagnosis not present

## 2018-06-22 DIAGNOSIS — I422 Other hypertrophic cardiomyopathy: Secondary | ICD-10-CM | POA: Diagnosis not present

## 2018-06-22 DIAGNOSIS — T82190A Other mechanical complication of cardiac electrode, initial encounter: Principal | ICD-10-CM | POA: Diagnosis present

## 2018-06-22 DIAGNOSIS — I4901 Ventricular fibrillation: Secondary | ICD-10-CM | POA: Diagnosis present

## 2018-06-22 DIAGNOSIS — R112 Nausea with vomiting, unspecified: Secondary | ICD-10-CM

## 2018-06-22 DIAGNOSIS — R110 Nausea: Secondary | ICD-10-CM

## 2018-06-22 HISTORY — DX: Other specified postprocedural states: Z98.890

## 2018-06-22 HISTORY — DX: Other complications of anesthesia, initial encounter: T88.59XA

## 2018-06-22 HISTORY — DX: Nausea with vomiting, unspecified: R11.2

## 2018-06-22 HISTORY — DX: Adverse effect of unspecified anesthetic, initial encounter: T41.45XA

## 2018-06-22 HISTORY — DX: Presence of cardiac pacemaker: Z95.0

## 2018-06-22 LAB — BASIC METABOLIC PANEL
Anion gap: 9 (ref 5–15)
BUN: 16 mg/dL (ref 8–23)
CO2: 25 mmol/L (ref 22–32)
Calcium: 9.5 mg/dL (ref 8.9–10.3)
Chloride: 104 mmol/L (ref 98–111)
Creatinine, Ser: 0.95 mg/dL (ref 0.44–1.00)
GFR calc Af Amer: >60 mL/min (ref >60)
GFR calc non Af Amer: 59 mL/min — ABNORMAL LOW (ref >60)
Glucose, Bld: 125 mg/dL — ABNORMAL HIGH (ref 70–99)
Potassium: 3.3 mmol/L — ABNORMAL LOW (ref 3.5–5.1)
Sodium: 138 mmol/L (ref 135–145)

## 2018-06-22 LAB — MAGNESIUM: Magnesium: 2 mg/dL (ref 1.7–2.4)

## 2018-06-22 MED ORDER — ATORVASTATIN CALCIUM 40 MG PO TABS
40.0000 mg | ORAL_TABLET | Freq: Every day | ORAL | Status: DC
  Administered 2018-06-22 – 2018-06-26 (×5): 40 mg via ORAL
  Filled 2018-06-22 (×5): qty 1

## 2018-06-22 MED ORDER — SOTALOL HCL 120 MG PO TABS
120.0000 mg | ORAL_TABLET | Freq: Two times a day (BID) | ORAL | Status: DC
  Administered 2018-06-23: 120 mg via ORAL
  Filled 2018-06-22: qty 1

## 2018-06-22 MED ORDER — ASPIRIN EC 81 MG PO TBEC
81.0000 mg | DELAYED_RELEASE_TABLET | Freq: Every day | ORAL | Status: DC
  Administered 2018-06-23 – 2018-06-27 (×5): 81 mg via ORAL
  Filled 2018-06-22 (×6): qty 1

## 2018-06-22 MED ORDER — POTASSIUM CHLORIDE ER 10 MEQ PO TBCR
10.0000 meq | EXTENDED_RELEASE_TABLET | Freq: Every day | ORAL | Status: DC
  Administered 2018-06-22: 10 meq via ORAL
  Filled 2018-06-22 (×2): qty 1

## 2018-06-22 MED ORDER — MAGNESIUM OXIDE 400 (241.3 MG) MG PO TABS
400.0000 mg | ORAL_TABLET | Freq: Every day | ORAL | Status: DC
  Administered 2018-06-22 – 2018-06-27 (×6): 400 mg via ORAL
  Filled 2018-06-22 (×6): qty 1

## 2018-06-22 MED ORDER — METOPROLOL SUCCINATE ER 25 MG PO TB24
25.0000 mg | ORAL_TABLET | Freq: Every day | ORAL | Status: DC
  Administered 2018-06-22 – 2018-06-23 (×2): 25 mg via ORAL
  Filled 2018-06-22 (×2): qty 1

## 2018-06-22 MED ORDER — FUROSEMIDE 40 MG PO TABS
40.0000 mg | ORAL_TABLET | Freq: Every day | ORAL | Status: DC
  Administered 2018-06-23 – 2018-06-24 (×2): 40 mg via ORAL
  Filled 2018-06-22 (×4): qty 1

## 2018-06-22 MED ORDER — LISINOPRIL 2.5 MG PO TABS
2.5000 mg | ORAL_TABLET | Freq: Two times a day (BID) | ORAL | Status: DC
  Administered 2018-06-22 – 2018-06-24 (×4): 2.5 mg via ORAL
  Filled 2018-06-22 (×6): qty 1

## 2018-06-22 MED ORDER — SPIRONOLACTONE 12.5 MG HALF TABLET
12.5000 mg | ORAL_TABLET | Freq: Every day | ORAL | Status: DC
  Administered 2018-06-22 – 2018-06-27 (×5): 12.5 mg via ORAL
  Filled 2018-06-22 (×6): qty 1

## 2018-06-22 MED ORDER — NITROGLYCERIN 0.4 MG SL SUBL: 0.4000 mg | SUBLINGUAL_TABLET | SUBLINGUAL | Status: DC | PRN

## 2018-06-22 NOTE — Progress Notes (Signed)
Pt due Lasix this pm- pt concerned about urinating all night. Paged to see if dose can be rescheduled to am

## 2018-06-22 NOTE — Progress Notes (Signed)
Patient Care Team: [name], [name], MD as PCP - General (Family Medicine)   HPI  Aimee Edwards is a 73 y.o. female Seen in follow-up for hypertrophic cardiomyopathy status post septal myectomy. She has a history of ICD implantation. She has had interval atrial fibrillation resulting in inappropriate ICD discharges. She's been managed with anticoagulation.  She has had problems with atrial lead with over sensing  DATE TEST EF   4/18 Echo   45-50 % Severe>>See Below --Moderate  11/19 Echo  60-65% Severe LAE  1/20 Echo  40.45%   1/20 LHC   Cors normal LVEDP 23   Date Cr K Hgb  8/19 0.75 4.3 12.3  06/22/18 1.09 3.6       She has a history of mitral regurgitation with comments suggesting severe MR and moderate AI.  Have reviewed with PR >> mod-central  Medical management recommended   [facility].  She was seen by EP felt to have had ventricular tachycardia (agree See Below) and started on amiodarone.  Warfarin dose was appropriately decreased  Device was reprogrammed VVI because atrial lead issues.  She is extremely anxious and short of breath  No edema  Chart notes that she would like to be seen closer to home.   .    Past Medical History:  Diagnosis Date  . Atrial fibrillation (HCC)   . Back pain   . Cardiomyopathy, hypertrophic obstructive (HCC)   . Degeneration of cervical intervertebral disc   . Dyslipidemia   . Edema   . Gastroesophageal reflux disease   . Hernia, abdominal   . Hypokalemia   . ICD (implantable cardioverter-defibrillator) in place   . Mitral regurgitation   . Pinched nerve   . Reflux   . Right shoulder pain   . Shortness of breath on exertion   . Sleep apnea     Past Surgical History:  Procedure Laterality Date  . CARDIAC CATHETERIZATION    . COLONSCOPY    . CORONARY ARTERY BYPASS GRAFT    . ICD GENERATOR CHANGE    . MYOMECTOMY    . OTHER HEART/PERICARD OPS    . PACEMAKER  INSERTION    . TONSILLECTOMY    . VAGINAL HYSTERECTOMY      Current Outpatient Medications  Medication Sig Dispense Refill  . amiodarone (PACERONE) 200 MG tablet Take 2 tablets by mouth 3 (three) times daily.    Marland Kitchen. aspirin EC 81 MG tablet Take 81 mg by mouth daily.    Marland Kitchen. atorvastatin (LIPITOR) 40 MG tablet Take 1 tablet (40 mg total) by mouth daily at 6 PM. 90 tablet 3  . furosemide (LASIX) 40 MG tablet TAKE ONE TABLET BY MOUTH DAILY 30 tablet 11  . lisinopril (PRINIVIL,ZESTRIL) 5 MG tablet Take 2.5 mg by mouth 2 (two) times daily.    . magnesium oxide (MAG-OX) 400 MG tablet Take 1 tablet (400 mg total) by mouth daily. 90 tablet 3  . metoprolol succinate (TOPROL-XL) 25 MG 24 hr tablet Take 1 tablet (25 mg total) by mouth daily. 90 tablet 1  . potassium chloride (K-DUR) 10 MEQ tablet TAKE ONE TABLET BY MOUTH DAILY 90 tablet 2  . spironolactone (ALDACTONE) 25 MG tablet Take 0.5 tablets (12.5 mg total) by mouth daily. 90 tablet 3  . warfarin (COUMADIN) 5 MG tablet Take 1 tablet (5 mg) by mouth daily as directed by the coumadin clinic 30 tablet 2   No current  facility-administered medications for this visit.     Allergies  Allergen Reactions  . Codeine Nausea And Vomiting  . Lortab [Hydrocodone-Acetaminophen] Nausea And Vomiting     Review of Systems negative except from HPI and PMH  Physical Exam BP (!) 144/80   Pulse 73   Ht 5\' 2"  (1.575 m)   Wt 154 lb 3.2 oz (69.9 kg)   SpO2 97%   BMI 28.20 kg/m  Well developed and nourished in no acute distress HENT normal Neck supple with JVP-8-10t Clear Regular rate and rhythm, no murmurs or gallops Abd-soft with active BS No Clubbing cyanosis tr edema Skin-warm and dry A & Oriented  Grossly normal sensory and motor function anxious    ECG  Sinus @ 73 18/15/43 \\LVH  QRS  Outside records reviewed as noted above   Assessment and  Plan Ventricular Tachycardia  Failed Defibrillation  Hypertrophic obstructive cardiomyopathy  status post septal myectomy 1996  Implantable defibrillator 1996? Indication  The patient's device was interrogated.  The information was reviewed. No changes were made in the programming.     Atrial fibrillation-paroxysmal-recurrent with inappropriate ICD shocks  Congestive heart failure-progressive-class IIb  Mitral regurgitation/aortic regurgitation question severity moderate  Labile INR  Atrial lead failure with over sensing.   The patient presented with ventricular tachycardia-recurrent self terminating but prompting shock therapy.  Unfortunately needed the 31 with a 41 J shock terminated for VT. Hence, we worry about the impact of amiodarone which was associated with increased DFT.  An alternative strategy would be to use sotalol which we do with some trepidation given the fact that the patient has hypertrophic heart disease.  We will plan to admit the patient discontinue the amiodarone.  We will begin sotalol at 120 mg twice daily.  We are also looking to see if we can identify whether there is an arrhythmia correlation with her spells of transient lightheadedness.  If so, we will be able to target PVCs/nonsustained VT suppression.   More than 50% of 40 min was spent in counseling related to the above

## 2018-06-22 NOTE — Telephone Encounter (Signed)
Called pt today regarding her recent ICD shock. Pt states she also had a "spell" yesterday which she went to the ED for. She states she feels like she is light headed and may pass out. Per the device clinic, pt recently received two separate shocks for VT and converted into Afib. Pt states she continues to feel poor. We have placed her on the schedule today to be seen by Dr Graciela Husbands for further evaluation. Pt has agreed to this.

## 2018-06-22 NOTE — H&P (Signed)
Please see Dr. Odessa Fleming note from today to serve as H&P  Aimee Dowse, PA-C

## 2018-06-22 NOTE — Patient Instructions (Signed)
Medication Instructions:  Your physician recommends that you continue on your current medications as directed. Please refer to the Current Medication list given to you today.  Labwork: None ordered.  Testing/Procedures: None ordered.  Follow-Up:  Please report to [facility] Admitting. Your room number should be [address].       Any Other Special Instructions Will Be Listed Below (If Applicable).     If you need a refill on your cardiac medications before your next appointment, please call your pharmacy.

## 2018-06-23 ENCOUNTER — Inpatient Hospital Stay (HOSPITAL_COMMUNITY): Payer: Medicare Other

## 2018-06-23 DIAGNOSIS — I361 Nonrheumatic tricuspid (valve) insufficiency: Secondary | ICD-10-CM

## 2018-06-23 DIAGNOSIS — I472 Ventricular tachycardia: Secondary | ICD-10-CM

## 2018-06-23 DIAGNOSIS — I340 Nonrheumatic mitral (valve) insufficiency: Secondary | ICD-10-CM

## 2018-06-23 LAB — MAGNESIUM: MAGNESIUM: 2.1 mg/dL (ref 1.7–2.4)

## 2018-06-23 LAB — PROTIME-INR
INR: 2.01
INR: 2.05
Prothrombin Time: 22.5 seconds — ABNORMAL HIGH (ref 11.4–15.2)
Prothrombin Time: 22.9 seconds — ABNORMAL HIGH (ref 11.4–15.2)

## 2018-06-23 LAB — BASIC METABOLIC PANEL
Anion gap: 9 (ref 5–15)
BUN: 15 mg/dL (ref 8–23)
CHLORIDE: 108 mmol/L (ref 98–111)
CO2: 27 mmol/L (ref 22–32)
Calcium: 9.3 mg/dL (ref 8.9–10.3)
Creatinine, Ser: 0.89 mg/dL (ref 0.44–1.00)
GFR calc Af Amer: >60 mL/min (ref >60)
GFR calc non Af Amer: >60 mL/min (ref >60)
Glucose, Bld: 103 mg/dL — ABNORMAL HIGH (ref 70–99)
Potassium: 3.7 mmol/L (ref 3.5–5.1)
SODIUM: 144 mmol/L (ref 135–145)

## 2018-06-23 LAB — ECHOCARDIOGRAM COMPLETE
Height: 62 in
Weight: 2473.6 oz

## 2018-06-23 MED ORDER — POTASSIUM CHLORIDE CRYS ER 20 MEQ PO TBCR
20.0000 meq | EXTENDED_RELEASE_TABLET | Freq: Every day | ORAL | Status: DC
  Administered 2018-06-23 – 2018-06-27 (×5): 20 meq via ORAL
  Filled 2018-06-23: qty 2
  Filled 2018-06-23: qty 1
  Filled 2018-06-23 (×4): qty 2
  Filled 2018-06-23 (×2): qty 1

## 2018-06-23 MED ORDER — POTASSIUM CHLORIDE CRYS ER 20 MEQ PO TBCR
20.0000 meq | EXTENDED_RELEASE_TABLET | Freq: Once | ORAL | Status: AC
  Administered 2018-06-23: 20 meq via ORAL
  Filled 2018-06-23: qty 1

## 2018-06-23 MED ORDER — WARFARIN SODIUM 5 MG PO TABS
5.0000 mg | ORAL_TABLET | Freq: Every day | ORAL | Status: DC
  Administered 2018-06-23 (×2): 5 mg via ORAL
  Filled 2018-06-23 (×2): qty 1

## 2018-06-23 MED ORDER — SOTALOL HCL 80 MG PO TABS
80.0000 mg | ORAL_TABLET | Freq: Two times a day (BID) | ORAL | Status: DC
  Administered 2018-06-23 – 2018-06-27 (×8): 80 mg via ORAL
  Filled 2018-06-23 (×8): qty 1

## 2018-06-23 MED ORDER — WARFARIN - PHARMACIST DOSING INPATIENT
Freq: Every day | Status: DC
  Administered 2018-06-23: 18:00:00

## 2018-06-23 NOTE — Progress Notes (Signed)
Progress Note  Patient Name: Aimee Edwards Date of Encounter: 06/23/2018  Primary Cardiologist: SL  Primary Electrophysiologist: SK   Patient Profile     73 y.o. female with HCM and prior defibrillator admitted for initiation of sotalol following failure of the ICD to convert ventricular tachycardia at maximum output.  She has a history of a myectomy.  DATE TEST EF   4/18 Echo   45-50 % Severe>>See Below --Moderate  11/19 Echo  60-65% Severe LAE  1/20 Echo  40.45%   1/20 LHC   Cors normal LVEDP 23     Subjective   No issues overnight.  Still anxious.  No shortness of breath or chest pain.  Some sensations of palpitations.  Inpatient Medications    Scheduled Meds: . aspirin EC  81 mg Oral Daily  . atorvastatin  40 mg Oral q1800  . furosemide  40 mg Oral Daily  . lisinopril  2.5 mg Oral BID  . magnesium oxide  400 mg Oral Daily  . potassium chloride  20 mEq Oral Daily  . sotalol  80 mg Oral Q12H  . spironolactone  12.5 mg Oral Daily  . warfarin  5 mg Oral q1800  . Warfarin - Pharmacist Dosing Inpatient   Does not apply q1800   Continuous Infusions:  PRN Meds: nitroGLYCERIN   Vital Signs    Vitals:   06/23/18 0935 06/23/18 1115 06/23/18 1157 06/23/18 1328  BP: (!) 108/55 (!) 103/53 (!) 109/51 103/60  Pulse:    (!) 56  Resp:      Temp:    97.7 F (36.5 C)  TempSrc:    Oral  SpO2:    95%  Weight:      Height:        Intake/Output Summary (Last 24 hours) at 06/23/2018 1850 Last data filed at 06/23/2018 1200 Gross per 24 hour  Intake -  Output 600 ml  Net -600 ml   Filed Weights   06/22/18 1616 06/23/18 0438  Weight: 69.9 kg 70.1 kg    Telemetry    Some ventricular pacing- Personally Reviewed  ECG    Sinus with a QTC (J TC) less than 500 ms- Personally Reviewed  Physical Exam    GEN: No acute distress.   Neck: JVD  cmflat Cardiac: RRR, 2/6 murmurs, rubs, or gallops.  Respiratory: Clear to auscultation bilaterally. GI: Soft,  nontender, non-distended  MS:  no edema; No deformity. Neuro:  Nonfocal  Psych: Normal affect  Skin Warm and dry   Labs    Chemistry Recent Labs  Lab 06/22/18 2100 06/23/18 0449  NA 138 144  K 3.3* 3.7  CL 104 108  CO2 25 27  GLUCOSE 125* 103*  BUN 16 15  CREATININE 0.95 0.89  CALCIUM 9.5 9.3  GFRNONAA 59* >60  GFRAA >60 >60  ANIONGAP 9 9     HematologyNo results for input(s): WBC, RBC, HGB, HCT, MCV, MCH, MCHC, RDW, PLT in the last 168 hours.  Cardiac EnzymesNo results for input(s): TROPONINI in the last 168 hours. No results for input(s): TROPIPOC in the last 168 hours.   BNPNo results for input(s): BNP, PROBNP in the last 168 hours.   DDimer No results for input(s): DDIMER in the last 168 hours.   Radiology    No results found.  Cardiac Studies   Echocardiogram EF 45%  Device Interrogation     *   Assessment & Plan    Ventricular tachycardia  Sinus bradycardia  Atrial  lead failure  Defibrillation threshold greater than 41 J  Hypertrophic cardiomyopathy status post myectomy  ICD-[employer]  Palpitations   We will continue with sotalol loading.  Given the bradycardia, we will discontinue metoprolol and decrease his sotalol dose from 120--80.  As she has functionally a single-chamber device worry about ventricular pacing which may also be responsible for her palpitations and so we will decrease the ventricular backup rate from VVI 50--VVI 40.  Need to undergo defibrillation threshold testing on Monday.  Continue potassium repletion.  May benefit from aldosterone antagonists  Her ICD is approaching ERI.  We may find that revision will be necessary prior to battery depletion to accommodate bradycardia and appropriate atrial sensing given the risk of atrial fibrillation  Discussed with Drs. Ladona Ridgel and [employer], Sherryl Manges, MD  06/23/2018, 6:50 PM

## 2018-06-23 NOTE — Progress Notes (Signed)
  Echocardiogram 2D Echocardiogram has been performed.  Tye Savoy 06/23/2018, 9:02 AM

## 2018-06-23 NOTE — Progress Notes (Addendum)
Reviewed post dose EKG with Dr. Graciela Husbands, and observation of brief periods of V pacing.   Will stop her metoprolol and reduce sotalol dose. I have asked [employer] rep to change pacing from VVI 50 > VVI 40  Aimee Edwards, [place]

## 2018-06-23 NOTE — Plan of Care (Signed)
  Problem: Education: Goal: Knowledge of General Education information will improve Description Including pain rating scale, medication(s)/side effects and non-pharmacologic comfort measures Outcome: Progressing   

## 2018-06-23 NOTE — Progress Notes (Signed)
ANTICOAGULATION CONSULT NOTE - Initial Consult  Pharmacy Consult for Warfarin  Indication: atrial fibrillation  Allergies  Allergen Reactions  . Codeine Nausea And Vomiting  . Lortab [Hydrocodone-Acetaminophen] Nausea And Vomiting    Patient Measurements: Height: 5\' 2"  (157.5 cm) Weight: 154 lb (69.9 kg) IBW/kg (Calculated) : 50.1  Vital Signs: Temp: 97.9 F (36.6 C) (01/30 1941) Temp Source: Oral (01/30 1941) BP: 130/66 (01/30 2047) Pulse Rate: 80 (01/30 2047)  Labs: Recent Labs    06/22/18 2100  LABPROT 22.5*  INR 2.01  CREATININE 0.95    Estimated Creatinine Clearance: 48.3 mL/min (by C-G formula based on SCr of 0.95 mg/dL).   Medical History: Past Medical History:  Diagnosis Date  . Atrial fibrillation (HCC)   . Back pain   . Cardiomyopathy, hypertrophic obstructive (HCC)   . Complication of anesthesia   . Degeneration of cervical intervertebral disc   . Dyslipidemia   . Edema   . Gastroesophageal reflux disease   . Hernia, abdominal   . Hypokalemia   . ICD (implantable cardioverter-defibrillator) in place   . Mitral regurgitation   . Pinched nerve   . PONV (postoperative nausea and vomiting)   . Presence of permanent cardiac pacemaker   . Reflux   . Right shoulder pain   . Shortness of breath on exertion   . Sleep apnea     Assessment: 73 y/o F on warfarin PTA for afib, INR is therapeutic at 2.01, direct admit from MD office with inappropriate ICD shocks, amiodarone has been discontinued   Goal of Therapy:  INR 2-3 Monitor platelets by anticoagulation protocol: Yes   Plan:  Warfarin 5 mg daily at 1800 Daily PT/INR Monitor for bleeding   Abran Duke 06/23/2018,12:52 AM

## 2018-06-24 LAB — BASIC METABOLIC PANEL
Anion gap: 12 (ref 5–15)
BUN: 21 mg/dL (ref 8–23)
CO2: 24 mmol/L (ref 22–32)
Calcium: 9.7 mg/dL (ref 8.9–10.3)
Chloride: 104 mmol/L (ref 98–111)
Creatinine, Ser: 1 mg/dL (ref 0.44–1.00)
GFR calc non Af Amer: 56 mL/min — ABNORMAL LOW (ref >60)
Glucose, Bld: 93 mg/dL (ref 70–99)
Potassium: 3.9 mmol/L (ref 3.5–5.1)
Sodium: 140 mmol/L (ref 135–145)

## 2018-06-24 LAB — PROTIME-INR
INR: 2.88
PROTHROMBIN TIME: 29.8 s — AB (ref 11.4–15.2)

## 2018-06-24 LAB — MAGNESIUM: Magnesium: 2.1 mg/dL (ref 1.7–2.4)

## 2018-06-24 MED ORDER — WARFARIN SODIUM 2 MG PO TABS
2.0000 mg | ORAL_TABLET | Freq: Once | ORAL | Status: AC
  Administered 2018-06-24: 2 mg via ORAL
  Filled 2018-06-24: qty 1

## 2018-06-24 MED ORDER — POTASSIUM CHLORIDE CRYS ER 10 MEQ PO TBCR
10.0000 meq | EXTENDED_RELEASE_TABLET | Freq: Once | ORAL | Status: AC
  Administered 2018-06-24: 10 meq via ORAL
  Filled 2018-06-24: qty 1

## 2018-06-24 NOTE — Progress Notes (Signed)
Progress Note  Patient Name: Aimee DrummerVirginia D Nicholes Date of Encounter: 06/24/2018  Primary Cardiologist: SL  Primary Electrophysiologist: SK   Patient Profile     73 y.o. female with HCM and prior defibrillator admitted for initiation of sotalol following failure of the ICD to convert ventricular tachycardia at maximum output.  She has a history of a myectomy.  DATE TEST EF   4/18 Echo   45-50 % Severe>>See Below --Moderate  11/19 Echo  60-65% Severe LAE  1/20 Echo  40.45%   1/20 LHC   Cors normal LVEDP 23     Subjective   No current issues.  Continues to be anxious.  She has no chest pain or shortness of breath.  She does feel palpitations when she eats.  Telemetry shows no major abnormality.  Inpatient Medications    Scheduled Meds: . aspirin EC  81 mg Oral Daily  . atorvastatin  40 mg Oral q1800  . furosemide  40 mg Oral Daily  . lisinopril  2.5 mg Oral BID  . magnesium oxide  400 mg Oral Daily  . potassium chloride  20 mEq Oral Daily  . sotalol  80 mg Oral Q12H  . spironolactone  12.5 mg Oral Daily  . warfarin  5 mg Oral q1800  . Warfarin - Pharmacist Dosing Inpatient   Does not apply q1800   Continuous Infusions:  PRN Meds: nitroGLYCERIN   Vital Signs    Vitals:   06/23/18 1328 06/23/18 2125 06/24/18 0256 06/24/18 0854  BP: 103/60 105/62 (!) 113/54 (!) 111/49  Pulse: (!) 56 60 63   Resp:  20 20   Temp: 97.7 F (36.5 C) 98.1 F (36.7 C) (!) 97.5 F (36.4 C)   TempSrc: Oral Oral Oral   SpO2: 95% 93% 94%   Weight:   68.2 kg   Height:        Intake/Output Summary (Last 24 hours) at 06/24/2018 0924 Last data filed at 06/24/2018 [id number] Gross per 24 hour  Intake 840 ml  Output 1000 ml  Net -160 ml   Filed Weights   06/22/18 1616 06/23/18 0438 06/24/18 0256  Weight: 69.9 kg 70.1 kg 68.2 kg    Telemetry    Sinus rhythm, rate 50s- Personally Reviewed  ECG    Sinus rhythm, QTC 466- Personally Reviewed  Physical Exam    GEN: Well nourished, well  developed, in no acute distress  HEENT: normal  Neck: no JVD, carotid bruits, or masses Cardiac: RRR; no murmurs, rubs, or gallops,no edema  Respiratory:  clear to auscultation bilaterally, normal work of breathing GI: soft, nontender, nondistended, + BS MS: no deformity or atrophy  Skin: warm and dry Neuro:  Strength and sensation are intact Psych: euthymic mood, full affect   Labs    Chemistry Recent Labs  Lab 06/22/18 2100 06/23/18 0449 06/24/18 0433  NA 138 144 140  K 3.3* 3.7 3.9  CL 104 108 104  CO2 25 27 24   GLUCOSE 125* 103* 93  BUN 16 15 21   CREATININE 0.95 0.89 1.00  CALCIUM 9.5 9.3 9.7  GFRNONAA 59* >60 56*  GFRAA >60 >60 >60  ANIONGAP 9 9 12      HematologyNo results for input(s): WBC, RBC, HGB, HCT, MCV, MCH, MCHC, RDW, PLT in the last 168 hours.  Cardiac EnzymesNo results for input(s): TROPONINI in the last 168 hours. No results for input(s): TROPIPOC in the last 168 hours.   BNPNo results for input(s): BNP, PROBNP in the last 168  hours.   DDimer No results for input(s): DDIMER in the last 168 hours.   Radiology    No results found.  Cardiac Studies   Echocardiogram EF 45%  Device Interrogation     *   Assessment & Plan    Ventricular tachycardia  Sinus bradycardia  Atrial lead failure  Defibrillation threshold greater than 41 J  Hypertrophic cardiomyopathy status post myectomy  ICD-[employer]  Palpitations   To new sotalol loading at 80 mg twice a day.  When I calculate her QTC, taking into account her bradycardia as well as her widened QRS, her QTC measures quite below 500 ms.  We will continue sotalol at the current dose.  She is planned for DFTs on Monday.    Keep potassium greater than 4 and magnesium greater than 2  Her ICD is approaching ERI.  She may require device upgrade at that time due to bradycardia with sotalol.  Signed, Will Jorja LoaMartin Camnitz, MD  06/24/2018, 9:24 AM

## 2018-06-24 NOTE — Progress Notes (Signed)
ANTICOAGULATION CONSULT NOTE   Pharmacy Consult for Warfarin  Indication: atrial fibrillation  Allergies  Allergen Reactions  . Codeine Nausea And Vomiting  . Lortab [Hydrocodone-Acetaminophen] Nausea And Vomiting    Patient Measurements: Height: 5\' 2"  (157.5 cm) Weight: 150 lb 4.8 oz (68.2 kg) IBW/kg (Calculated) : 50.1  Vital Signs: Temp: 97.5 F (36.4 C) (02/01 0256) Temp Source: Oral (02/01 0256) BP: 111/49 (02/01 0854) Pulse Rate: 63 (02/01 0256)  Labs: Recent Labs    06/22/18 2100 06/23/18 0449 06/24/18 0433  LABPROT 22.5* 22.9* 29.8*  INR 2.01 2.05 2.88  CREATININE 0.95 0.89 1.00    Estimated Creatinine Clearance: 45.3 mL/min (by C-G formula based on SCr of 1 mg/dL).   Medical History: Past Medical History:  Diagnosis Date  . Atrial fibrillation (HCC)   . Back pain   . Cardiomyopathy, hypertrophic obstructive (HCC)   . Complication of anesthesia   . Degeneration of cervical intervertebral disc   . Dyslipidemia   . Edema   . Gastroesophageal reflux disease   . Hernia, abdominal   . Hypokalemia   . ICD (implantable cardioverter-defibrillator) in place   . Mitral regurgitation   . Pinched nerve   . PONV (postoperative nausea and vomiting)   . Presence of permanent cardiac pacemaker   . Reflux   . Right shoulder pain   . Shortness of breath on exertion   . Sleep apnea     Assessment: 73 y/o F on warfarin PTA for afib, INR is therapeutic at 2.01, direct admit from MD office with inappropriate ICD shocks, amiodarone has been discontinued.  INR today is 2.88, increased from 2.05 yesterday. No s/sx of bleeding. Meal intake documented between 50-75% yesterday.  PTA regimen 5 mg daily. Has an INR machine at home and checks every Thursday or Friday.   Goal of Therapy:  INR 2-3 Monitor platelets by anticoagulation protocol: Yes   Plan:  Warfarin 2 mg tonight at 1800 given INR jump  Daily PT/INR Monitor for bleeding  Sherron MondayKimberly Ruddy Swire, PharmD,  BCCCP Clinical Pharmacist  Pager: [phone] Phone: [phone] 06/24/2018,12:07 PM

## 2018-06-24 NOTE — Discharge Instructions (Addendum)
NO DRIVING 6 MONTHS  PLEASE HAVE YOUR PRIMARY DOCTOR DO THE FOLLOWING IN THE NEXT 6-7 DAYS (AS WE DISCUSSED)  LABS: INR (coumadin check for your primary to manage as usual)  For Dr. Graciela Husbands, and please have them send to his office BMET (for your sotalol and medicine adjustment) EKG (for the sotalol monitoring) If you are unable to get the BMET/EKG done, we have an appointment for you at Dr. Odessa Fleming office if you need it      Information on my medicine - Coumadin   (Warfarin)  Why was Coumadin prescribed for you? Coumadin was prescribed for you because you have a blood clot or a medical condition that can cause an increased risk of forming blood clots. Blood clots can cause serious health problems by blocking the flow of blood to the heart, lung, or brain. Coumadin can prevent harmful blood clots from forming. As a reminder your indication for Coumadin is:   Stroke Prevention Because Of Atrial Fibrillation  What test will check on my response to Coumadin? While on Coumadin (warfarin) you will need to have an INR test regularly to ensure that your dose is keeping you in the desired range. The INR (international normalized ratio) number is calculated from the result of the laboratory test called prothrombin time (PT).  If an INR APPOINTMENT HAS NOT ALREADY BEEN MADE FOR YOU please schedule an appointment to have this lab work done by your health care provider within 7 days. Your INR goal is usually a number between:  2 to 3 or your provider may give you a more narrow range like 2-2.5.  Ask your health care provider during an office visit what your goal INR is.  What  do you need to  know  About  COUMADIN? Take Coumadin (warfarin) exactly as prescribed by your healthcare provider about the same time each day.  DO NOT stop taking without talking to the doctor who prescribed the medication.  Stopping without other blood clot prevention medication to take the place of Coumadin may increase your  risk of developing a new clot or stroke.  Get refills before you run out.  What do you do if you miss a dose? If you miss a dose, take it as soon as you remember on the same day then continue your regularly scheduled regimen the next day.  Do not take two doses of Coumadin at the same time.  Important Safety Information A possible side effect of Coumadin (Warfarin) is an increased risk of bleeding. You should call your healthcare provider right away if you experience any of the following: ? Bleeding from an injury or your nose that does not stop. ? Unusual colored urine (red or dark brown) or unusual colored stools (red or black). ? Unusual bruising for unknown reasons. ? A serious fall or if you hit your head (even if there is no bleeding).  Some foods or medicines interact with Coumadin (warfarin) and might alter your response to warfarin. To help avoid this: ? Eat a balanced diet, maintaining a consistent amount of Vitamin K. ? Notify your provider about major diet changes you plan to make. ? Avoid alcohol or limit your intake to 1 drink for women and 2 drinks for men per day. (1 drink is 5 oz. wine, 12 oz. beer, or 1.5 oz. liquor.)  Make sure that ANY health care provider who prescribes medication for you knows that you are taking Coumadin (warfarin).  Also make sure the healthcare provider who  is monitoring your Coumadin knows when you have started a new medication including herbals and non-prescription products.  Coumadin (Warfarin)  Major Drug Interactions  Increased Warfarin Effect Decreased Warfarin Effect  Alcohol (large quantities) Antibiotics (esp. Septra/Bactrim, Flagyl, Cipro) Amiodarone (Cordarone) Aspirin (ASA) Cimetidine (Tagamet) Megestrol (Megace) NSAIDs (ibuprofen, naproxen, etc.) Piroxicam (Feldene) Propafenone (Rythmol SR) Propranolol (Inderal) Isoniazid (INH) Posaconazole (Noxafil) Barbiturates (Phenobarbital) Carbamazepine (Tegretol) Chlordiazepoxide  (Librium) Cholestyramine (Questran) Griseofulvin Oral Contraceptives Rifampin Sucralfate (Carafate) Vitamin K   Coumadin (Warfarin) Major Herbal Interactions  Increased Warfarin Effect Decreased Warfarin Effect  Garlic Ginseng Ginkgo biloba Coenzyme Q10 Green tea St. Johns wort    Coumadin (Warfarin) FOOD Interactions  Eat a consistent number of servings per week of foods HIGH in Vitamin K (1 serving =  cup)  Collards (cooked, or boiled & drained) Kale (cooked, or boiled & drained) Mustard greens (cooked, or boiled & drained) Parsley *serving size only =  cup Spinach (cooked, or boiled & drained) Swiss chard (cooked, or boiled & drained) Turnip greens (cooked, or boiled & drained)  Eat a consistent number of servings per week of foods MEDIUM-HIGH in Vitamin K (1 serving = 1 cup)  Asparagus (cooked, or boiled & drained) Broccoli (cooked, boiled & drained, or raw & chopped) Brussel sprouts (cooked, or boiled & drained) *serving size only =  cup Lettuce, raw (green leaf, endive, romaine) Spinach, raw Turnip greens, raw & chopped   These websites have more information on Coumadin (warfarin):  FailFactory.se; VeganReport.com.au;

## 2018-06-25 LAB — BASIC METABOLIC PANEL
ANION GAP: 10 (ref 5–15)
BUN: 27 mg/dL — ABNORMAL HIGH (ref 8–23)
CO2: 25 mmol/L (ref 22–32)
Calcium: 9.4 mg/dL (ref 8.9–10.3)
Chloride: 102 mmol/L (ref 98–111)
Creatinine, Ser: 1.01 mg/dL — ABNORMAL HIGH (ref 0.44–1.00)
GFR calc Af Amer: >60 mL/min (ref >60)
GFR calc non Af Amer: 55 mL/min — ABNORMAL LOW (ref >60)
Glucose, Bld: 108 mg/dL — ABNORMAL HIGH (ref 70–99)
POTASSIUM: 4.3 mmol/L (ref 3.5–5.1)
Sodium: 137 mmol/L (ref 135–145)

## 2018-06-25 LAB — PROTIME-INR
INR: 3.71
Prothrombin Time: 36.2 seconds — ABNORMAL HIGH (ref 11.4–15.2)

## 2018-06-25 LAB — MAGNESIUM: MAGNESIUM: 2.1 mg/dL (ref 1.7–2.4)

## 2018-06-25 NOTE — Progress Notes (Signed)
ANTICOAGULATION CONSULT NOTE   Pharmacy Consult for Warfarin  Indication: atrial fibrillation  Allergies  Allergen Reactions  . Codeine Nausea And Vomiting  . Lortab [Hydrocodone-Acetaminophen] Nausea And Vomiting    Patient Measurements: Height: 5\' 2"  (157.5 cm) Weight: 150 lb 9.6 oz (68.3 kg) IBW/kg (Calculated) : 50.1  Vital Signs: Temp: 97.8 F (36.6 C) (02/02 0613) Temp Source: Oral (02/02 0613) BP: 97/51 (02/02 0710) Pulse Rate: 54 (02/02 0613)  Labs: Recent Labs    06/23/18 0449 06/24/18 0433 06/25/18 0517  LABPROT 22.9* 29.8* 36.2*  INR 2.05 2.88 3.71  CREATININE 0.89 1.00 1.01*    Estimated Creatinine Clearance: 45 mL/min (A) (by C-G formula based on SCr of 1.01 mg/dL (H)).   Medical History: Past Medical History:  Diagnosis Date  . Atrial fibrillation (HCC)   . Back pain   . Cardiomyopathy, hypertrophic obstructive (HCC)   . Complication of anesthesia   . Degeneration of cervical intervertebral disc   . Dyslipidemia   . Edema   . Gastroesophageal reflux disease   . Hernia, abdominal   . Hypokalemia   . ICD (implantable cardioverter-defibrillator) in place   . Mitral regurgitation   . Pinched nerve   . PONV (postoperative nausea and vomiting)   . Presence of permanent cardiac pacemaker   . Reflux   . Right shoulder pain   . Shortness of breath on exertion   . Sleep apnea     Assessment: 73 y/o F on warfarin PTA for afib, INR is therapeutic at 2.01, direct admit from MD office with inappropriate ICD shocks, amiodarone has been discontinued.  INR today increased from 2.88 to 3.71. No s/sx of bleeding.   PTA regimen 5 mg daily. Has an INR machine at home and checks every Thursday or Friday.   Goal of Therapy:  INR 2-3 Monitor platelets by anticoagulation protocol: Yes   Plan:  Hold warfarin tonight Daily PT/INR Monitor for bleeding  Sherron Monday, PharmD, BCCCP Clinical Pharmacist  Pager: [phone] Phone: [phone] 06/25/2018,1:46  PM

## 2018-06-25 NOTE — Progress Notes (Signed)
I spoke with Dr. Elberta Fortis. He looked at the EKG and said it looked fine. Patient informed that MD stated it was fine and as she was having anxiety about EKG results. Will continue to monitor

## 2018-06-25 NOTE — Progress Notes (Signed)
I text paged Ward Givens, NP in regards to 2 hrs after betapace EKG. Awaiting return of call

## 2018-06-25 NOTE — Progress Notes (Signed)
Progress Note  Patient Name: Aimee DrummerVirginia D Seabolt Date of Encounter: 06/25/2018  Primary Cardiologist: SL  Primary Electrophysiologist: SK   Patient Profile     73 y.o. female with HCM and prior defibrillator admitted for initiation of sotalol following failure of the ICD to convert ventricular tachycardia at maximum output.  She has a history of a myectomy.  DATE TEST EF   4/18 Echo   45-50 % Severe>>See Below --Moderate  11/19 Echo  60-65% Severe LAE  1/20 Echo  40.45%   1/20 LHC   Cors normal LVEDP 23     Subjective   Current issues.  Continues to be anxious.  Blood pressure low this morning and thus we Sid Greener plan to hold blood pressure medications.  Inpatient Medications    Scheduled Meds: . aspirin EC  81 mg Oral Daily  . atorvastatin  40 mg Oral q1800  . furosemide  40 mg Oral Daily  . lisinopril  2.5 mg Oral BID  . magnesium oxide  400 mg Oral Daily  . potassium chloride SA  20 mEq Oral Daily  . sotalol  80 mg Oral Q12H  . spironolactone  12.5 mg Oral Daily  . Warfarin - Pharmacist Dosing Inpatient   Does not apply q1800   Continuous Infusions:  PRN Meds: nitroGLYCERIN   Vital Signs    Vitals:   06/24/18 2046 06/25/18 0613 06/25/18 0615 06/25/18 0710  BP: (!) 94/59 (!) 103/54  (!) 97/51  Pulse: (!) 58 (!) 54    Resp:  20    Temp:  97.8 F (36.6 C)    TempSrc:  Oral    SpO2: 95% 96%    Weight:   68.3 kg   Height:        Intake/Output Summary (Last 24 hours) at 06/25/2018 0933 Last data filed at 06/25/2018 [id number] Gross per 24 hour  Intake -  Output 250 ml  Net -250 ml   Filed Weights   06/23/18 0438 06/24/18 0256 06/25/18 0615  Weight: 70.1 kg 68.2 kg 68.3 kg    Telemetry    This rhythm, rate 50s- Personally Reviewed  ECG    Sinus rhythm, QTC 500- Personally Reviewed  Physical Exam    .GEN: Well nourished, well developed, in no acute distress  HEENT: normal  Neck: no JVD, carotid bruits, or masses Cardiac: RRR; no murmurs, rubs, or  gallops,no edema  Respiratory:  clear to auscultation bilaterally, normal work of breathing GI: soft, nontender, nondistended, + BS MS: no deformity or atrophy  Skin: warm and dry, device site well healed Neuro:  Strength and sensation are intact Psych: euthymic mood, full affect    Labs    Chemistry Recent Labs  Lab 06/23/18 0449 06/24/18 0433 06/25/18 0517  NA 144 140 137  K 3.7 3.9 4.3  CL 108 104 102  CO2 27 24 25   GLUCOSE 103* 93 108*  BUN 15 21 27*  CREATININE 0.89 1.00 1.01*  CALCIUM 9.3 9.7 9.4  GFRNONAA >60 56* 55*  GFRAA >60 >60 >60  ANIONGAP 9 12 10      HematologyNo results for input(s): WBC, RBC, HGB, HCT, MCV, MCH, MCHC, RDW, PLT in the last 168 hours.  Cardiac EnzymesNo results for input(s): TROPONINI in the last 168 hours. No results for input(s): TROPIPOC in the last 168 hours.   BNPNo results for input(s): BNP, PROBNP in the last 168 hours.   DDimer No results for input(s): DDIMER in the last 168 hours.   Radiology  No results found.  Cardiac Studies   Echocardiogram EF 45%  Device Interrogation     *   Assessment & Plan    Ventricular tachycardia  Sinus bradycardia  Atrial lead failure  Defibrillation threshold greater than 41 J  Hypertrophic cardiomyopathy status post myectomy  ICD-[employer]  Palpitations   Currently on sotalol loading at 80 mg twice a day.  Her QTC is certainly long on her most recent EKG, but when corrected for QRS prolongation, is right around 500 ms.  She does have a defibrillator and I feel that higher doses of sotalol Lazaria Schaben help with DFT testing.  Would continue at her current dose.  If her next dose results in a prolonged QTC, Sharren Schnurr likely plan for daily dosing of sotalol.    Magnesium greater than 2, potassium greater than 4  May require ICD upgrade to a dual-chamber device.  Device nearing ERI.  Plan for DFT testing tomorrow with Dr. Ladona Ridgel.  Signed, Bravery Ketcham Jorja Loa, MD  06/25/2018,  9:33 AM

## 2018-06-25 NOTE — Plan of Care (Signed)
  Problem: Education: Goal: Knowledge of General Education information will improve Description Including pain rating scale, medication(s)/side effects and non-pharmacologic comfort measures Outcome: Progressing   

## 2018-06-26 ENCOUNTER — Inpatient Hospital Stay (HOSPITAL_COMMUNITY): Payer: Medicare Other

## 2018-06-26 ENCOUNTER — Encounter (HOSPITAL_COMMUNITY): Payer: Self-pay | Admitting: Internal Medicine

## 2018-06-26 ENCOUNTER — Encounter (HOSPITAL_COMMUNITY)
Admission: AD | Disposition: A | Discharge status: Home / Self Care | Payer: Self-pay | Source: Ambulatory Visit | Attending: Internal Medicine

## 2018-06-26 DIAGNOSIS — R110 Nausea: Secondary | ICD-10-CM

## 2018-06-26 DIAGNOSIS — I421 Obstructive hypertrophic cardiomyopathy: Secondary | ICD-10-CM

## 2018-06-26 HISTORY — PX: ICD/BIV ICD FUNCTION(DFT) TEST: EP1204

## 2018-06-26 LAB — PROTIME-INR
INR: 2.72
Prothrombin Time: 28.4 seconds — ABNORMAL HIGH (ref 11.4–15.2)

## 2018-06-26 LAB — BASIC METABOLIC PANEL
Anion gap: 10 (ref 5–15)
BUN: 25 mg/dL — AB (ref 8–23)
CO2: 24 mmol/L (ref 22–32)
Calcium: 9.1 mg/dL (ref 8.9–10.3)
Chloride: 105 mmol/L (ref 98–111)
Creatinine, Ser: 1.07 mg/dL — ABNORMAL HIGH (ref 0.44–1.00)
GFR calc Af Amer: 60 mL/min — ABNORMAL LOW (ref >60)
GFR calc non Af Amer: 51 mL/min — ABNORMAL LOW (ref >60)
Glucose, Bld: 98 mg/dL (ref 70–99)
POTASSIUM: 4.4 mmol/L (ref 3.5–5.1)
Sodium: 139 mmol/L (ref 135–145)

## 2018-06-26 LAB — CUP PACEART INCLINIC DEVICE CHECK
Date Time Interrogation Session: 20200130050000
HighPow Impedance: 46 Ohm
HighPow Impedance: 63 Ohm
Implantable Lead Implant Date: 19970916
Implantable Lead Implant Date: 19970916
Implantable Lead Location: 753859
Implantable Lead Location: 753860
Implantable Lead Model: 125
Implantable Lead Serial Number: 222062
Implantable Lead Serial Number: 279972
Implantable Pulse Generator Implant Date: 20101020
Lead Channel Impedance Value: 482 Ohm
Lead Channel Pacing Threshold Amplitude: 1.1 V
Lead Channel Pacing Threshold Amplitude: 4.5 V
Lead Channel Pacing Threshold Pulse Width: 0.4 ms
Lead Channel Pacing Threshold Pulse Width: 1 ms
Lead Channel Sensing Intrinsic Amplitude: 1.4 mV
Lead Channel Sensing Intrinsic Amplitude: 18.2 mV
Lead Channel Setting Pacing Amplitude: 2.6 V
Lead Channel Setting Pacing Pulse Width: 0.4 ms
MDC IDC MSMT LEADCHNL RA IMPEDANCE VALUE: 393 Ohm
MDC IDC SET LEADCHNL RV SENSING SENSITIVITY: 0.6 mV
Pulse Gen Serial Number: 149386

## 2018-06-26 LAB — MAGNESIUM: Magnesium: 2.2 mg/dL (ref 1.7–2.4)

## 2018-06-26 SURGERY — ICD/BIV ICD FUNCTION (DFT) TEST

## 2018-06-26 MED ORDER — MIDAZOLAM HCL 5 MG/5ML IJ SOLN
INTRAMUSCULAR | Status: AC
  Filled 2018-06-26: qty 5

## 2018-06-26 MED ORDER — FENTANYL CITRATE (PF) 100 MCG/2ML IJ SOLN
INTRAMUSCULAR | Status: AC
  Filled 2018-06-26: qty 2

## 2018-06-26 MED ORDER — FENTANYL CITRATE (PF) 100 MCG/2ML IJ SOLN
INTRAMUSCULAR | Status: DC | PRN
  Administered 2018-06-26: 12.5 ug via INTRAVENOUS

## 2018-06-26 MED ORDER — SODIUM CHLORIDE 0.9 % IV SOLN
INTRAVENOUS | Status: DC
  Administered 2018-06-26 – 2018-06-27 (×2): via INTRAVENOUS

## 2018-06-26 MED ORDER — MIDAZOLAM HCL 5 MG/5ML IJ SOLN
INTRAMUSCULAR | Status: DC | PRN
  Administered 2018-06-26: 2 mg via INTRAVENOUS
  Administered 2018-06-26 (×3): 1 mg via INTRAVENOUS

## 2018-06-26 MED ORDER — ONDANSETRON HCL 4 MG/2ML IJ SOLN
4.0000 mg | Freq: Once | INTRAMUSCULAR | Status: AC
  Administered 2018-06-26: 4 mg via INTRAVENOUS
  Filled 2018-06-26: qty 2

## 2018-06-26 MED ORDER — IOHEXOL 300 MG/ML  SOLN
100.0000 mL | Freq: Once | INTRAMUSCULAR | Status: AC | PRN
  Administered 2018-06-26: 100 mL via INTRAVENOUS

## 2018-06-26 MED ORDER — WARFARIN SODIUM 5 MG PO TABS
5.0000 mg | ORAL_TABLET | Freq: Once | ORAL | Status: AC
  Administered 2018-06-26: 5 mg via ORAL
  Filled 2018-06-26: qty 1

## 2018-06-26 SURGICAL SUPPLY — 2 items
PAD DEFIB LIFELINK (PAD) ×1 IMPLANT
PAD ELECT DEFIB RADIOL ZOLL (MISCELLANEOUS) ×1 IMPLANT

## 2018-06-26 NOTE — Plan of Care (Signed)
  Problem: Education: Goal: Ability to manage disease process will improve Outcome: Progressing Note:  Pt understands that she will need to take sotalol every 12 hours at home and that she cannot miss a dose.

## 2018-06-26 NOTE — Progress Notes (Signed)
Lisinopril held this am d/t low BP - pt refused PO lasix prior to going for threshold testing and upon return.

## 2018-06-26 NOTE — Progress Notes (Signed)
ANTICOAGULATION CONSULT NOTE  Pharmacy Consult:  Coumadin Indication: atrial fibrillation  Allergies  Allergen Reactions  . Codeine Nausea And Vomiting  . Lortab [Hydrocodone-Acetaminophen] Nausea And Vomiting    Patient Measurements: Height: 5\' 2"  (157.5 cm) Weight: 150 lb 6.4 oz (68.2 kg) IBW/kg (Calculated) : 50.1  Vital Signs: Temp: 97.5 F (36.4 C) (02/03 0529) Temp Source: Oral (02/03 0529) BP: 117/53 (02/03 0529) Pulse Rate: 59 (02/03 0529)  Labs: Recent Labs    06/24/18 0433 06/25/18 0517 06/26/18 0520  LABPROT 29.8* 36.2* 28.4*  INR 2.88 3.71 2.72  CREATININE 1.00 1.01* 1.07*    Estimated Creatinine Clearance: 42.4 mL/min (A) (by C-G formula based on SCr of 1.07 mg/dL (H)).   Assessment: 73 YOF on Coumadin PTA for Afib, INR is therapeutic at 2.01, direct admit from MD office with inappropriate ICD shocks, amiodarone has been discontinued.  INR increased from 2.88 to 3.71 on 06/25/18 likely from cumulative doses of 5mg  x 2 on 06/23/18. Coumadin dose reduced on 06/24/18 and held on 06/25/18, and INR decreased significantly to therapeutic level today.  No bleeding reported.  Home Coumadin dose: 5mg  daily. Has an INR machine at home and checks every Thursday or Friday.   Goal of Therapy:  INR 2-3 Monitor platelets by anticoagulation protocol: Yes   Plan:  Coumadin 5mg  PO today Daily PT / INR   Smrithi Pigford D. Laney Potash, PharmD, BCPS, BCCCP 06/26/2018, 9:02 AM

## 2018-06-26 NOTE — Care Management Important Message (Signed)
Important Message  Patient Details  Name: Aimee Edwards MRN: [id number] Date of Birth: 10-Dec-1945   Medicare Important Message Given:  Yes    Jayci Ellefson P Tuck Dulworth 06/26/2018, 4:14 PM

## 2018-06-26 NOTE — Progress Notes (Addendum)
Progress Note  Patient Name: Aimee Edwards Date of Encounter: 06/26/2018  Primary Cardiologist: SL  Primary Electrophysiologist: SK   Patient Profile     73 y.o. female with HCM and prior defibrillator admitted for initiation of sotalol following failure of the ICD to convert ventricular tachycardia at maximum output.  She has a history of a myectomy.  DATE TEST EF   4/18 Echo   45-50 % Severe>>See Below --Moderate  11/19 Echo  60-65% Severe LAE  1/20 Echo  40.45%   1/20 LHC   Cors normal LVEDP 23     Subjective  Nauseated and weak this morning also with loose stool.  Lightheadedness accompanies nausea but is not associated with standing  Inpatient Medications    Scheduled Meds: . aspirin EC  81 mg Oral Daily  . atorvastatin  40 mg Oral q1800  . furosemide  40 mg Oral Daily  . lisinopril  2.5 mg Oral BID  . magnesium oxide  400 mg Oral Daily  . potassium chloride SA  20 mEq Oral Daily  . sotalol  80 mg Oral Q12H  . spironolactone  12.5 mg Oral Daily  . Warfarin - Pharmacist Dosing Inpatient   Does not apply q1800   Continuous Infusions:  PRN Meds: nitroGLYCERIN   Vital Signs    Vitals:   06/25/18 0710 06/25/18 1416 06/25/18 2010 06/26/18 0529  BP: (!) 97/51 (!) 107/52 (!) 104/54 (!) 117/53  Pulse:  (!) 53 (!) 57 (!) 59  Resp:    20  Temp:  97.8 F (36.6 C) 98 F (36.7 C) (!) 97.5 F (36.4 C)  TempSrc:  Oral Oral Oral  SpO2:  96% 95% 95%  Weight:    68.2 kg  Height:        Intake/Output Summary (Last 24 hours) at 06/26/2018 5621 Last data filed at 06/25/2018 2000 Gross per 24 hour  Intake 240 ml  Output -  Net 240 ml   Filed Weights   06/24/18 0256 06/25/18 0615 06/26/18 0529  Weight: 68.2 kg 68.3 kg 68.2 kg    Telemetry    Personally reviewed  sinus  ECG  ECG personally reviewed JTc (QTc- QRS  ) < 400 msec  Physical Exam  Well developed and nourished in no acute distress but looks miserable HENT normal Neck supple with  JVP-flat Clear Regular rate and rhythm, no murmurs or gallops Abd-soft with active BS no right upper quadrant tenderness No Clubbing cyanosis edema Skin-warm and dry A & Oriented  Grossly normal sensory and motor function      Labs    Chemistry Recent Labs  Lab 06/23/18 0449 06/24/18 0433 06/25/18 0517  NA 144 140 137  K 3.7 3.9 4.3  CL 108 104 102  CO2 27 24 25   GLUCOSE 103* 93 108*  BUN 15 21 27*  CREATININE 0.89 1.00 1.01*  CALCIUM 9.3 9.7 9.4  GFRNONAA >60 56* 55*  GFRAA >60 >60 >60  ANIONGAP 9 12 10      HematologyNo results for input(s): WBC, RBC, HGB, HCT, MCV, MCH, MCHC, RDW, PLT in the last 168 hours.  Cardiac EnzymesNo results for input(s): TROPONINI in the last 168 hours. No results for input(s): TROPIPOC in the last 168 hours.   BNPNo results for input(s): BNP, PROBNP in the last 168 hours.   DDimer No results for input(s): DDIMER in the last 168 hours.   Radiology    No results found.  Cardiac Studies   Echocardiogram EF 45%  Device Interrogation     *   Assessment & Plan    Ventricular tachycardia  Sinus bradycardia  Atrial lead failure  Defibrillation threshold greater than 41 J  Hypertrophic cardiomyopathy status post myectomy  ICD-[employer]  Palpitations  Nausea and diarrhea    continue sotalol  For DFT testing today although it may be that the sotalol is responsible for her nausea and her diarrhea.  And treated symptomatically initially and then undertake DFTs if she feels better.  If she cannot tolerate sotalol, we will change to dofetilide, it to be associated with a 30% or so reduction in defibrillation thresholds.  Her nausea and weakness may also be coming from bradycardia and hypotension.  We will check a right upper quadrant ultrasound.  Begin IV fluids.  Her episodes of weakness correlated poorly with nonsustained SVT.  Previously been thought to be associated with ventricular pacing, this now is moved  as her rate has been reduced to 40-VVI  Discussed the fact that she may need system revision.     Signed, Sherryl Manges, MD  06/26/2018, 8:12 AM

## 2018-06-26 NOTE — H&P (Signed)
Progress Note  Patient Name: Aimee Edwards Date of Encounter: 06/26/2018  Primary Cardiologist: SL  Primary Electrophysiologist: SK   Patient Profile     73 y.o. female with HCM and prior defibrillator admitted for initiation of sotalol following failure of the ICD to convert ventricular tachycardia at maximum output.  She has a history of a myectomy.  DATE TEST EF   4/18 Echo  45-50 % Severe>>See Below --Moderate  11/19 Echo  60-65% Severe LAE  1/20 Echo 40.45%   1/20 LHC  Cors normal LVEDP 23     Subjective  Nauseated and weak this morning also with loose stool.  Lightheadedness accompanies nausea but is not associated with standing  Inpatient Medications    Scheduled Meds: . aspirin EC  81 mg Oral Daily  . atorvastatin  40 mg Oral q1800  . furosemide  40 mg Oral Daily  . lisinopril  2.5 mg Oral BID  . magnesium oxide  400 mg Oral Daily  . potassium chloride SA  20 mEq Oral Daily  . sotalol  80 mg Oral Q12H  . spironolactone  12.5 mg Oral Daily  . Warfarin - Pharmacist Dosing Inpatient   Does not apply q1800   Continuous Infusions: PRN Meds: nitroGLYCERIN   Vital Signs          Vitals:   06/25/18 0710 06/25/18 1416 06/25/18 2010 06/26/18 0529  BP: (!) 97/51 (!) 107/52 (!) 104/54 (!) 117/53  Pulse:  (!) 53 (!) 57 (!) 59  Resp:    20  Temp:  97.8 F (36.6 C) 98 F (36.7 C) (!) 97.5 F (36.4 C)  TempSrc:  Oral Oral Oral  SpO2:  96% 95% 95%  Weight:    68.2 kg  Height:        Intake/Output Summary (Last 24 hours) at 06/26/2018 7001 Last data filed at 06/25/2018 2000    Gross per 24 hour  Intake 240 ml  Output -  Net 240 ml        Filed Weights   06/24/18 0256 06/25/18 0615 06/26/18 0529  Weight: 68.2 kg 68.3 kg 68.2 kg    Telemetry    Personally reviewed  sinus  ECG  ECG personally reviewed JTc (QTc- QRS  ) < 400 msec  Physical Exam  Well developed and nourished in no acute distress but looks  miserable HENT normal Neck supple with JVP-flat Clear Regular rate and rhythm, no murmurs or gallops Abd-soft with active BS no right upper quadrant tenderness No Clubbing cyanosis edema Skin-warm and dry A & Oriented  Grossly normal sensory and motor function      Labs    Chemistry      Recent Labs  Lab 06/23/18 0449 06/24/18 0433 06/25/18 0517  NA 144 140 137  K 3.7 3.9 4.3  CL 108 104 102  CO2 27 24 25   GLUCOSE 103* 93 108*  BUN 15 21 27*  CREATININE 0.89 1.00 1.01*  CALCIUM 9.3 9.7 9.4  GFRNONAA >60 56* 55*  GFRAA >60 >60 >60  ANIONGAP 9 12 10      HematologyNo results for input(s): WBC, RBC, HGB, HCT, MCV, MCH, MCHC, RDW, PLT in the last 168 hours.  Cardiac EnzymesNo results for input(s): TROPONINI in the last 168 hours. No results for input(s): TROPIPOC in the last 168 hours.   BNPNo results for input(s): BNP, PROBNP in the last 168 hours.   DDimer No results for input(s): DDIMER in the last 168 hours.   Radiology  No results found.  Cardiac Studies   Echocardiogram EF 45%  Device Interrogation     *   Assessment & Plan    Ventricular tachycardia  Sinus bradycardia  Atrial lead failure  Defibrillation threshold greater than 41 J  Hypertrophic cardiomyopathy status post myectomy  ICD-[employer]  Palpitations  Nausea and diarrhea    continue sotalol  For DFT testing today although it may be that the sotalol is responsible for her nausea and her diarrhea.  And treated symptomatically initially and then undertake DFTs if she feels better.  If she cannot tolerate sotalol, we will change to dofetilide, it to be associated with a 30% or so reduction in defibrillation thresholds.  Her nausea and weakness may also be coming from bradycardia and hypotension.  We will check a right upper quadrant ultrasound.  Begin IV fluids.  Her episodes of weakness correlated poorly with nonsustained SVT.  Previously  been thought to be associated with ventricular pacing, this now is moved as her rate has been reduced to 40-VVI  Discussed the fact that she may need system revision.     Signed, Sherryl Manges, MD  06/26/2018, 8:12 AM    EP Attending  Patient seen and examined. I discussed the indications/risks/benefits/goals/expectations of DFT testing with the patient and she wishes to proceed.  Aimee Reeves.D.

## 2018-06-27 ENCOUNTER — Other Ambulatory Visit: Payer: Self-pay | Admitting: Physician Assistant

## 2018-06-27 DIAGNOSIS — I472 Ventricular tachycardia, unspecified: Secondary | ICD-10-CM

## 2018-06-27 DIAGNOSIS — Z79899 Other long term (current) drug therapy: Secondary | ICD-10-CM

## 2018-06-27 LAB — BASIC METABOLIC PANEL
ANION GAP: 9 (ref 5–15)
BUN: 19 mg/dL (ref 8–23)
CALCIUM: 8.9 mg/dL (ref 8.9–10.3)
CO2: 22 mmol/L (ref 22–32)
Chloride: 107 mmol/L (ref 98–111)
Creatinine, Ser: 0.92 mg/dL (ref 0.44–1.00)
GFR calc Af Amer: >60 mL/min (ref >60)
GFR calc non Af Amer: >60 mL/min (ref >60)
Glucose, Bld: 94 mg/dL (ref 70–99)
Potassium: 4.3 mmol/L (ref 3.5–5.1)
Sodium: 138 mmol/L (ref 135–145)

## 2018-06-27 LAB — PROTIME-INR
INR: 2.42
PROTHROMBIN TIME: 26 s — AB (ref 11.4–15.2)

## 2018-06-27 LAB — MAGNESIUM: MAGNESIUM: 2.1 mg/dL (ref 1.7–2.4)

## 2018-06-27 MED ORDER — WARFARIN SODIUM 5 MG PO TABS: 5.0000 mg | ORAL_TABLET | Freq: Once | ORAL | Status: DC

## 2018-06-27 MED ORDER — SOTALOL HCL 80 MG PO TABS: 80.0000 mg | ORAL_TABLET | Freq: Two times a day (BID) | ORAL | 6 refills | Status: DC

## 2018-06-27 NOTE — Progress Notes (Signed)
Progress Note  Patient Name: Aimee Edwards Date of Encounter: 06/27/2018  Primary Cardiologist: SL  Primary Electrophysiologist: SK   Patient Profile     73 y.o. female with HCM and prior defibrillator admitted for initiation of sotalol following failure of the ICD to convert ventricular tachycardia at maximum output.  She has a history of a myectomy.  DATE TEST EF   4/18 Echo   45-50 % Severe>>See Below --Moderate  11/19 Echo  60-65% Severe LAE  1/20 Echo  40.45%   1/20 LHC   Cors normal LVEDP 23     Subjective  DFT acceptable yday   Much less nausea  Improved strength   Inpatient Medications    Scheduled Meds: . aspirin EC  81 mg Oral Daily  . atorvastatin  40 mg Oral q1800  . furosemide  40 mg Oral Daily  . lisinopril  2.5 mg Oral BID  . magnesium oxide  400 mg Oral Daily  . potassium chloride SA  20 mEq Oral Daily  . sotalol  80 mg Oral Q12H  . spironolactone  12.5 mg Oral Daily  . Warfarin - Pharmacist Dosing Inpatient   Does not apply q1800   Continuous Infusions: . sodium chloride 50 mL/hr at 06/27/18 0035   PRN Meds: nitroGLYCERIN   Vital Signs    Vitals:   06/26/18 2020 06/26/18 2210 06/27/18 0341 06/27/18 0446  BP: (!) 89/50 (!) 97/47  (!) 90/52  Pulse: (!) 56   (!) 52  Resp:    11  Temp: 97.6 F (36.4 C)   97.7 F (36.5 C)  TempSrc: Oral   Oral  SpO2: 94%   94%  Weight:   69.7 kg   Height:        Intake/Output Summary (Last 24 hours) at 06/27/2018 0739 Last data filed at 06/27/2018 0400 Gross per 24 hour  Intake 1415 ml  Output -  Net 1415 ml   Filed Weights   06/25/18 0615 06/26/18 0529 06/27/18 0341  Weight: 68.3 kg 68.2 kg 69.7 kg    Telemetry    Telemetry Personally reviewed  sinus  ECG  ECG personally reviewed  Sinus @ 53 20/14/55 ( 52)   Physical Exam   Well developed and nourished in no acute distress HENT normal Neck supple with JVP-flat Clear Regular rate and rhythm, no murmurs or gallops Abd-soft with  active BS No Clubbing cyanosis edema Skin-warm and dry A & Oriented  Grossly normal sensory and motor function    Labs    Chemistry Recent Labs  Lab 06/25/18 0517 06/26/18 0520 06/27/18 0407  NA 137 139 138  K 4.3 4.4 4.3  CL 102 105 107  CO2 25 24 22   GLUCOSE 108* 98 94  BUN 27* 25* 19  CREATININE 1.01* 1.07* 0.92  CALCIUM 9.4 9.1 8.9  GFRNONAA 55* 51* >60  GFRAA >60 60* >60  ANIONGAP 10 10 9      HematologyNo results for input(s): WBC, RBC, HGB, HCT, MCV, MCH, MCHC, RDW, PLT in the last 168 hours.  Cardiac EnzymesNo results for input(s): TROPONINI in the last 168 hours. No results for input(s): TROPIPOC in the last 168 hours.   BNPNo results for input(s): BNP, PROBNP in the last 168 hours.   DDimer No results for input(s): DDIMER in the last 168 hours.   Radiology    US Abdomen Limited  Result Date: 06/26/2018 CLINICAL DATA:  Nausea and vomiting since November 2019, history atrial fibrillation, hypertrophic obstructive cardiomyopathy, mitral  valvular disease EXAM: ULTRASOUND ABDOMEN LIMITED RIGHT UPPER QUADRANT COMPARISON:  None FINDINGS: Gallbladder: Normally distended without stones or wall thickening. No sonographic Murphy sign. Tiny rounded collection of fluid seen adjacent to the gallbladder, 8 x 5 x 5 mm, question small adjacent cyst versus diverticulum versus intramural diverticulosis. Common bile duct: Diameter: 3 mm diameter, normal Liver: Multiple small cysts within the liver, largest 3.5 x 2.8 x 3.5 cm at LEFT lobe, complicated by a partial septation and an irregular wall. Portal vein is patent on color Doppler imaging with normal direction of blood flow towards the liver. No RIGHT upper quadrant free fluid. IMPRESSION: Question tiny cyst versus diverticulum or intramural diverticulosis of the gallbladder accounting for an 8 mm diameter rounded fluid collection adjacent to the gallbladder wall. Hepatic cysts, largest 3.5 cm diameter appearing complicated by a  septation and wall irregularity; characterization of the hepatic abnormalities by MR imaging with and without contrast recommended; if patient is unable to have an MR, recommend multiphase CT imaging. Electronically Signed   By: Ulyses SouthwardMark  Boles M.D.   On: 06/26/2018 12:56   Ct Abdomen W Wo Contrast  Result Date: 06/26/2018 CLINICAL DATA:  Liver lesion for further characterization EXAM: CT ABDOMEN WITHOUT AND WITH CONTRAST TECHNIQUE: Multidetector CT imaging of the abdomen was performed following the standard protocol before and following the bolus administration of intravenous contrast. CONTRAST:  100mL OMNIPAQUE IOHEXOL 300 MG/ML  SOLN COMPARISON:  Abdominal ultrasound of 06/26/2018 and report from CT chest dated 09/20/2002 FINDINGS: Lower chest: AICD lead noted along the right ventricle. Mild scarring in the left lower lobe. Mild cardiomegaly. Small type 1 hiatal hernia. Hepatobiliary: Multiple pole hepatic fluid density lesions are present compatible with cysts. These were reported back in 2004. Difficult to exclude mild septation versus adjacent cysts posteriorly along the lateral segment left hepatic lobe and also along the caudate lobe, but there is no internal nodularity or associated calcifications. These lesions have generally benign characteristics. No appreciable thick or nodular septation. No measurable degree of enhancement. Gallbladder unremarkable. No biliary dilatation. Rim calcified 1.0 by 1.1 cm exophytic structure along the inferior margin of the right hepatic lobe as shown on image 23/3, mild internal complexity but no appreciable enhancement. Pancreas: Unremarkable Spleen: Unremarkable Adrenals/Urinary Tract: No significant findings. Stomach/Bowel: Unremarkable Vascular/Lymphatic: Aortoiliac atherosclerotic vascular disease. No pathologic adenopathy Other: No supplemental non-categorized findings. Musculoskeletal: Unremarkable IMPRESSION: 1. The hepatic cysts have benign imaging characteristics.  The appearance of septation in the left hepatic lobe may simply be from 2 adjacent cysts, or a very thin septation in a single cyst, without nodularity or abnormal enhancement. Multiple hepatic cysts were noted on the prior CT chest of 09/20/2002. 2. There is a rim calcified 1.1 cm structure extending inferiorly from the right hepatic lobe margin, probably either a focus of fat necrosis or a chronic calcified cyst. This does not have appreciable enhancement and is likely benign. 3.  Aortic Atherosclerosis (ICD10-I70.0). 4. Mild cardiomegaly. 5. Small type 1 hiatal hernia. Electronically Signed   By: Gaylyn RongWalter  Liebkemann M.D.   On: 06/26/2018 16:16    Cardiac Studies   Echocardiogram EF 45%  Device Interrogation        Assessment & Plan    Ventricular tachycardia  Sinus bradycardia  Atrial lead failure  Defibrillation threshold greater than 41 J  Hypertrophic cardiomyopathy status post myectomy  ICD-[employer]  Palpitations  Nausea and diarrhea   DFT within range  N & D improved today  Will continue sotalol Imaging of  RUQ US revealing of nothing severe-- hepatic cysts appear to be benign      Discussed the fact that she may need system revision.  This remains open       Signed, Sherryl MangesSteven Celicia Minahan, MD  06/27/2018, 7:39 AM

## 2018-06-27 NOTE — Discharge Summary (Addendum)
DISCHARGE SUMMARY    Patient ID: Aimee Edwards,  MRN: [id number], DOB/AGE: [birth year] 73 y.o.  Admit date: 06/22/2018 Discharge date: 06/27/2018  Primary Care Physician: [name], [name], MD  Primary Cardiologist/Electrophysiologist: Dr. Graciela HusbandsKlein  Primary Discharge Diagnosis:  1. Failed ICD shock 2. Relative hypotension   Secondary Discharge Diagnosis:  1. HCM     H/o septal myomectomy 2. Paroxysmal AFib     CHA2DS2Vasc is 2, on warfarin, monitored and managed by her PMD  3. Dizziness/nausea/headaches     Ongoing intermittently for a few months     She has neurology and vascular appointments this week  Allergies  Allergen Reactions  . Codeine Nausea And Vomiting  . Lortab [Hydrocodone-Acetaminophen] Nausea And Vomiting     Procedures This Admission:  1. DFT testing 06/26/2018, Dr. Ladona Ridgelaylor Conclusion: Successful defibrillation threshold test in a patient who had been initiated on sotalol.  Ventricular fibrillation was successfully terminated with a 21 J shock delivered through the device after a T wave induction shock  Brief HPI: Aimee Edwards is a 73 y.o. female with PMHx inncluding above, [facility] Sotalol initiation after failed ICD shocks for VT  Hospital Course:  The patient was admitted her electrolytes and QT monitored all of which remained stable.  She remained in SR throughout her stay without VT or AF, rates generally 50's, her ICD pacing was reprogrammed to VVI 40 to avoid asynchronous V pacing.  The patient voiced a steady undertone of nausea, and intermittent "funny feelings", these did not correspond to any findings on telemetry.   GI w/u with RUQ US and CT liver without any acute/worricome findings.  Ultimately the patient reported that she has in place neurology and vascular evaluation in process with appointments this week out patient for further w/u on a recent finding of her internal carotid artery on the left near the base of the skull  demonstrates a beaded appearance.  Consider vasculitis such as Takayasu arteritis or perhaps fibromuscular dysplasia.  Given none of her symptoms are new, and her PMD has been managing her w/u for these complaints, no further to be done in-patient.  DFT testing yesterday was successful at 21J.  This morning, the patient is actually feeling better, no active nausea.  I observed her ambulate independently to the bathroom and back without difficulty.  Her BP has been generally 90's systolic.  This not far from what appears to be her baseline in the last year in review by Dr.Klein.  She has not gotten her lisinopril or lasix in a couple days, will continue her off these, and stop her potassium as well.  Will keep her low dose spironolactone.  Plan for EKG and BMET in 1 week and follow up with Dr. Graciela HusbandsKlein in 3 weeks.   The patient was made aware of [place] law and recommendation, no driving for 6 months    Physical Exam: Vitals:   06/26/18 2210 06/27/18 0341 06/27/18 0446 06/27/18 0800  BP: (!) 97/47  (!) 90/52 (!) 95/42  Pulse:   (!) 52 (!) 50  Resp:   11 15  Temp:   97.7 F (36.5 C) 98.2 F (36.8 C)  TempSrc:   Oral Oral  SpO2:   94% 96%  Weight:  69.7 kg    Height:        GEN- The patient is well appearing, alert and oriented x 3 today.   HEENT: normocephalic, atraumatic; sclera clear, conjunctiva pink; hearing intact; oropharynx  clear; neck supple, no JVP Lungs- CTA b/l, normal work of breathing.  No wheezes, rales, rhonchi Heart- RRR, no murmurs, rubs or gallops, PMI not laterally displaced GI- soft, non-tender, non-distended Extremities- no clubbing, cyanosis, or edema; MS- no significant deformity, age appropriate atrophy Skin- warm and dry, no rash or lesion Psych- euthymic mood, full affect Neuro- no gross deficits   Labs:   Lab Results  Component Value Date   WBC 5.8 01/03/2018   HGB 12.3 01/03/2018   HCT 35.9 01/03/2018   MCV 93 01/03/2018   PLT 270 01/03/2018      Recent Labs  Lab 06/27/18 0407  NA 138  K 4.3  CL 107  CO2 22  BUN 19  CREATININE 0.92  CALCIUM 8.9  GLUCOSE 94    Discharge Medications:  Allergies as of 06/27/2018      Reactions   Codeine Nausea And Vomiting   Lortab [hydrocodone-acetaminophen] Nausea And Vomiting      Medication List    STOP taking these medications   amiodarone 200 MG tablet Commonly known as:  PACERONE   furosemide 40 MG tablet Commonly known as:  LASIX   lisinopril 5 MG tablet Commonly known as:  PRINIVIL,ZESTRIL   metoprolol succinate 25 MG 24 hr tablet Commonly known as:  TOPROL-XL   potassium chloride 10 MEQ tablet Commonly known as:  K-DUR     TAKE these medications   aspirin EC 81 MG tablet Take 81 mg by mouth daily.   atorvastatin 40 MG tablet Commonly known as:  LIPITOR Take 1 tablet (40 mg total) by mouth daily at 6 PM.   magnesium oxide 400 MG tablet Commonly known as:  MAG-OX Take 1 tablet (400 mg total) by mouth daily.   sotalol 80 MG tablet Commonly known as:  BETAPACE Take 1 tablet (80 mg total) by mouth every 12 (twelve) hours.   spironolactone 25 MG tablet Commonly known as:  ALDACTONE Take 0.5 tablets (12.5 mg total) by mouth daily.   warfarin 5 MG tablet Commonly known as:  COUMADIN Take 1 tablet (5 mg) by mouth daily as directed by the coumadin clinic What changed:    how much to take  how to take this  when to take this  additional instructions       Disposition: Home Discharge Instructions    Diet - low sodium heart healthy   Complete by:  As directed    Increase activity slowly   Complete by:  As directed      Follow-up Information    Nelda Bucks, MD Follow up.   Specialty:  Family Medicine Why:  Please call for a coumadin check, lab (BMET)/EKG to be done in the next 6-7 days as we discussed, and is written in your instructions Contact information: [address] [facility] [address] [phone]        [facility]  [address] Office Follow up.   Specialty:  Cardiology Why:  07/03/2018 @ 11:00AM, lab/EKG (this is for Sotalol monitoring, not your warfarin) if unable to get done at your primary doctor's office Contact information: [address], [address] [place] [address] [phone]       Duke Salvia, MD Follow up.   Specialty:  Cardiology Why:  07/19/2018 @ 4:00PM Contact information: [address] [place] [address] [phone]           Duration of Discharge Encounter: Greater than 30 minutes including physician time.  Signed,  Francis Dowse, PA-C 06/27/2018 9:27 AM

## 2018-06-27 NOTE — Progress Notes (Signed)
ANTICOAGULATION CONSULT NOTE  Pharmacy Consult:  Coumadin Indication: atrial fibrillation  Allergies  Allergen Reactions  . Codeine Nausea And Vomiting  . Lortab [Hydrocodone-Acetaminophen] Nausea And Vomiting    Patient Measurements: Height: 5\' 2"  (157.5 cm) Weight: 153 lb 11.2 oz (69.7 kg) IBW/kg (Calculated) : 50.1  Vital Signs: Temp: 98.2 F (36.8 C) (02/04 0800) Temp Source: Oral (02/04 0800) BP: 90/52 (02/04 0446) Pulse Rate: 52 (02/04 0446)  Labs: Recent Labs    06/25/18 0517 06/26/18 0520 06/27/18 0407  LABPROT 36.2* 28.4* 26.0*  INR 3.71 2.72 2.42  CREATININE 1.01* 1.07* 0.92    Estimated Creatinine Clearance: 49.8 mL/min (by C-G formula based on SCr of 0.92 mg/dL).   Assessment: 73 YOF on Coumadin PTA for Afib, INR is therapeutic at 2.01, direct admit from MD office with inappropriate ICD shocks, amiodarone has been discontinued.  INR therapeutic today; no bleeding reported.  Home Coumadin dose: 5mg  daily. Has an INR machine at home and checks every Thursday or Friday.   Goal of Therapy:  INR 2-3 Monitor platelets by anticoagulation protocol: Yes   Plan:  Repeat Coumadin 5mg  PO today Daily PT / INR   Keeton Kassebaum D. Laney Potash, PharmD, BCPS, BCCCP 06/27/2018, 8:43 AM

## 2018-06-27 NOTE — Progress Notes (Signed)
Patient received discharge information and acknowledged understanding of it. Patient received printed prescription. Patient IVs were removed.   

## 2018-07-03 ENCOUNTER — Other Ambulatory Visit: Payer: Medicare Other | Admitting: *Deleted

## 2018-07-03 ENCOUNTER — Ambulatory Visit (INDEPENDENT_AMBULATORY_CARE_PROVIDER_SITE_OTHER): Payer: Medicare Other

## 2018-07-03 DIAGNOSIS — I472 Ventricular tachycardia, unspecified: Secondary | ICD-10-CM

## 2018-07-03 DIAGNOSIS — Z79899 Other long term (current) drug therapy: Secondary | ICD-10-CM

## 2018-07-03 LAB — BASIC METABOLIC PANEL
BUN/Creatinine Ratio: 15 (ref 12–28)
BUN: 13 mg/dL (ref 8–27)
CO2: 23 mmol/L (ref 20–29)
CREATININE: 0.84 mg/dL (ref 0.57–1.00)
Calcium: 9.9 mg/dL (ref 8.7–10.3)
Chloride: 103 mmol/L (ref 96–106)
GFR calc non Af Amer: 69 mL/min/{1.73_m2} (ref >59)
GFR, EST AFRICAN AMERICAN: 80 mL/min/{1.73_m2} (ref >59)
Glucose: 113 mg/dL — ABNORMAL HIGH (ref 65–99)
Potassium: 4.3 mmol/L (ref 3.5–5.2)
Sodium: 142 mmol/L (ref 134–144)

## 2018-07-03 NOTE — Progress Notes (Signed)
1.) Reason for visit: EKG, Sotalol start  2.) Name of MD requesting visit: Dr. Graciela HusbandsKlein   3.) H&P: VT and AF  4.) ROS related to problem: The patient is still feeling fatigued after the hospitalization.   5.) Assessment and plan per MD: Spoke with Dr. Graciela HusbandsKlein, he stated the patient should continue current therapy and she was fine to return to work when she feels ready. I advised the patient, that [place] law stated 3 months with no driving and [facility].

## 2018-07-05 ENCOUNTER — Telehealth: Payer: Self-pay | Admitting: Internal Medicine

## 2018-07-05 MED ORDER — SOTALOL HCL 80 MG PO TABS: 40.0000 mg | ORAL_TABLET | Freq: Two times a day (BID) | ORAL | 6 refills | Status: DC

## 2018-07-05 NOTE — Telephone Encounter (Signed)
Transmission reviewed, no VHR episodes recorded, pt has monitor zone at 140bpm, Presenting EGM SB 51, pt programmed VVI 40, hx of atrial lead failure per SK notes, atrial oversensing.

## 2018-07-05 NOTE — Telephone Encounter (Signed)
Per Dr Graciela HusbandsKlein; he wants pt to decrease her Sotolol to 40mg , half tablet, two times per day. She will also need to follow up with him next Monday 2/17 @ 1015 for a follow up appt for possible device changes.   Pt has verbalized understanding of the above. She will call the office if anything changes between now and then.

## 2018-07-05 NOTE — Telephone Encounter (Signed)
Pt calling to report her HR feels like it has been "speeding up and slowing down" with no activity. She states this is similar feeling to how she felt before she was shocked recently.  She has sent in several remote check. Forwarding to Device triage to investigate. Pt understands we will contact her back with report.

## 2018-07-05 NOTE — Telephone Encounter (Signed)
°  New message    Patient calling with concerns about HR and a "strange  feeling"  STAT if HR is under 50 or over 120 (normal HR is 60-100 beats per minute)  1) What is your heart rate? 66 from Applewatch  2) Do you have a log of your heart rate readings (document readings)?   3) Do you have any other symptoms? no

## 2018-07-07 ENCOUNTER — Telehealth: Payer: Self-pay | Admitting: Internal Medicine

## 2018-07-07 NOTE — Telephone Encounter (Signed)
Per pt checks INR weekly at home and will contact PMD (who follows ) and give results so may make changes with Warfarin and will let Dr Graciela Husbands know ,as well.

## 2018-07-07 NOTE — Telephone Encounter (Signed)
New Message         Patient is calling today because her INR is 8.0

## 2018-07-07 NOTE — Telephone Encounter (Signed)
We do not manage patient's warfarin. She will need to call INR result in to managing physician. It looks as though she is being followed by the [facility] in [place]. Phone note 07/26/2017 states that PCP Kelby Fam is managing INR and that pt has a home INR monitor. Pt will need to call INR result to her for dosing instructions.

## 2018-07-10 ENCOUNTER — Encounter: Payer: Self-pay | Admitting: Internal Medicine

## 2018-07-10 ENCOUNTER — Ambulatory Visit (INDEPENDENT_AMBULATORY_CARE_PROVIDER_SITE_OTHER): Payer: Medicare Other | Admitting: Internal Medicine

## 2018-07-10 VITALS — BP 126/82 | HR 64 | Ht 62.0 in | Wt 151.8 lb

## 2018-07-10 DIAGNOSIS — I472 Ventricular tachycardia, unspecified: Secondary | ICD-10-CM

## 2018-07-10 LAB — PROTIME-INR
INR: 3.9 — ABNORMAL HIGH (ref 0.8–1.2)
Prothrombin Time: 37.5 s — ABNORMAL HIGH (ref 9.1–12.0)

## 2018-07-10 NOTE — Patient Instructions (Addendum)
Medication Instructions:  Your physician recommends that you continue on your current medications as directed. Please refer to the Current Medication list given to you today.  Continue taking 40mg  Sotolol, half tablet, twice daily.  Labwork: You will have labs drawn today: Stat INR.  We will call [facility] clinic when it has resulted.  Testing/Procedures: None ordered.  Follow-Up: Your physician recommends that you schedule a follow-up appointment in:   6 months with Dr Graciela Husbands  Any Other Special Instructions Will Be Listed Below (If Applicable).     If you need a refill on your cardiac medications before your next appointment, please call your pharmacy.

## 2018-07-10 NOTE — Progress Notes (Signed)
Patient Care Team: Nelda Bucks, MD as PCP - General (Family Medicine)   HPI  Aimee Edwards is a 73 y.o. female Seen in follow-up for hypertrophic cardiomyopathy status post septal myectomy. She has a history of ICD implantation. She has had interval atrial fibrillation resulting in inappropriate ICD discharges. She's been managed with anticoagulation.  She has had problems with atrial lead with over sensing  DATE TEST EF   4/18 Echo   45-50 % Severe>>See Below --Moderate  11/19 Echo  60-65% Severe LAE  1/20 Echo  40.45%   1/20 LHC   Cors normal LVEDP 23   Date Cr K Hgb  8/19 0.75 4.3 12.3  06/22/18 1.09 3.6       She has a history of mitral regurgitation with comments suggesting severe MR and moderate AI.  Have reviewed with PR >> mod-central  Medical management recommended   She was hospitalized [facility] because of an ICD discharges.  She was seen by EP felt to have had ventricular tachycardia (agree See Below) and started on amiodarone.  Warfarin dose was appropriately decreased  Device was reprogrammed VVI because atrial lead issues.  When seen following discharge, it was noted that she had failed to convert with maximum output.  She was hospitalized and started on sotalol 80 mg twice daily.  She underwent DFT testing with defibrillation threshold less than 21 J.  She however has not been able to tolerate 80 sotalol twice daily secondary to spells of lightheadedness and weakness.  And we decreased it to 40 mg twice daily.  She is feeling much better.  Much  She is less anxious.  Her breathing is better.  There is no edema.       Past Medical History:  Diagnosis Date  . Atrial fibrillation (HCC)   . Back pain   . Cardiomyopathy, hypertrophic obstructive (HCC)   . Complication of anesthesia   . Degeneration of cervical intervertebral disc   . Dyslipidemia   . Edema   . Gastroesophageal reflux disease   . Hernia, abdominal   . Hypokalemia   . ICD  (implantable cardioverter-defibrillator) in place   . Mitral regurgitation   . Pinched nerve   . PONV (postoperative nausea and vomiting)   . Presence of permanent cardiac pacemaker   . Reflux   . Right shoulder pain   . Shortness of breath on exertion   . Sleep apnea     Past Surgical History:  Procedure Laterality Date  . CARDIAC CATHETERIZATION    . COLONSCOPY    . CORONARY ARTERY BYPASS GRAFT    . ICD GENERATOR CHANGE    . ICD/BIV ICD FUNCTION(DFT) TEST N/A 06/26/2018   Procedure: ICD/BIV ICD FUNCTION (DFT) TEST;  Surgeon: Marinus Maw, MD;  Location: [facility] INVASIVE CV LAB;  Service: Cardiovascular;  Laterality: N/A;  . MYOMECTOMY    . OTHER HEART/PERICARD OPS    . PACEMAKER INSERTION    . TONSILLECTOMY    . VAGINAL HYSTERECTOMY      Current Outpatient Medications  Medication Sig Dispense Refill  . aspirin EC 81 MG tablet Take 81 mg by mouth daily.    Marland Kitchen atorvastatin (LIPITOR) 40 MG tablet Take 1 tablet (40 mg total) by mouth daily at 6 PM. 90 tablet 3  . magnesium oxide (MAG-OX) 400 MG tablet Take 1 tablet (400 mg total) by mouth daily. 90 tablet 3  . sotalol (BETAPACE) 80 MG tablet Take 0.5 tablets (40 mg total)  by mouth every 12 (twelve) hours. 60 tablet 6  . spironolactone (ALDACTONE) 25 MG tablet Take 0.5 tablets (12.5 mg total) by mouth daily. 90 tablet 3  . warfarin (COUMADIN) 5 MG tablet Take 1 tablet (5 mg) by mouth daily as directed by the coumadin clinic (Patient taking differently: Take 5 mg by mouth at bedtime. ) 30 tablet 2   No current facility-administered medications for this visit.     Allergies  Allergen Reactions  . Codeine Nausea And Vomiting  . Lortab [Hydrocodone-Acetaminophen] Nausea And Vomiting     Review of Systems negative except from HPI and PMH  Physical Exam BP 126/82   Pulse 64   Ht 5\' 2"  (1.575 m)   Wt 151 lb 12.8 oz (68.9 kg)   SpO2 96%   BMI 27.76 kg/m  .ell developed and nourished in no acute distress HENT normal Neck  supple with JVP-flat Clear Regular rate and rhythm, 2/6 m Abd-soft with active BS No Clubbing cyanosis edema Skin-warm and dry A & Oriented  Grossly normal sensory and motor function     ECG sinus at 64 16/14/44 IVCD LVH with re-pol   Assessment and  Plan Ventricular Tachycardia  Elevated defibrillation thresholds prompting the introduction of sotalol with adequate DFTs thereafter  Hypertrophic obstructive cardiomyopathy status post septal myectomy 1996  Implantable defibrillator 1996? Indication  The patient's device was interrogated.  The information was reviewed. No changes were made in the programming.     Atrial fibrillation-paroxysmal-recurrent with inappropriate ICD shocks  Congestive heart failure-progressive-class IIb  Mitral regurgitation/aortic regurgitation question severity moderate  Labile INR  Atrial lead failure with over sensing.   She is much improved on the lower dose of sotalol.  No interval ventricular arrhythmias.  Euvolemic.  We will continue current medications.  No clear indication for aspirin.  Started following carotid Dopplers.  Will discontinue.  I reviewed the possibility of considering a NOAC  Less anxiety related to her ICD shocks  Currently staying with her son and everybody thinks that the much better arrangement  We spent more than 50% of our >25 min visit in face to face counseling regarding the above

## 2018-07-11 LAB — CUP PACEART INCLINIC DEVICE CHECK
Date Time Interrogation Session: 20200217050000
HIGH POWER IMPEDANCE MEASURED VALUE: 63 Ohm
HighPow Impedance: 52 Ohm
Implantable Lead Implant Date: 19970916
Implantable Lead Implant Date: 19970916
Implantable Lead Location: 753859
Implantable Lead Location: 753860
Implantable Lead Model: 125
Implantable Lead Serial Number: 222062
Implantable Lead Serial Number: 279972
Implantable Pulse Generator Implant Date: 20101020
Lead Channel Impedance Value: 398 Ohm
Lead Channel Impedance Value: 475 Ohm
Lead Channel Pacing Threshold Amplitude: 1.1 V
Lead Channel Pacing Threshold Amplitude: 4.5 V
Lead Channel Pacing Threshold Pulse Width: 0.4 ms
Lead Channel Pacing Threshold Pulse Width: 1 ms
Lead Channel Sensing Intrinsic Amplitude: 1.9 mV
Lead Channel Sensing Intrinsic Amplitude: 18.6 mV
Lead Channel Setting Pacing Amplitude: 2.6 V
MDC IDC SET LEADCHNL RV PACING PULSEWIDTH: 0.4 ms
MDC IDC SET LEADCHNL RV SENSING SENSITIVITY: 0.6 mV
Pulse Gen Serial Number: 149386

## 2018-07-12 ENCOUNTER — Encounter: Payer: Medicare Other | Admitting: Nurse Practitioner

## 2018-07-13 ENCOUNTER — Telehealth: Payer: Self-pay | Admitting: *Deleted

## 2018-07-13 NOTE — Telephone Encounter (Signed)
LMOVM TO CALL CLINIC BACK FOR RESULTS 

## 2018-07-13 NOTE — Telephone Encounter (Signed)
-----   Message from Sheilah Pigeon, [place] sent at 07/06/2018  8:29 PM EST ----- Lab looks great.  She was also to have had a level done.  [place] away.  If this can get done by her PMD next week (and send [place] the result as well).  Also please make sure she followed up with her PMD for her coumadin monitoring after her discharge as well.  Thanks [employer]

## 2018-07-17 ENCOUNTER — Telehealth: Payer: Self-pay | Admitting: Internal Medicine

## 2018-07-17 DIAGNOSIS — R002 Palpitations: Secondary | ICD-10-CM

## 2018-07-17 NOTE — Telephone Encounter (Signed)
Follow up   PT says she punched a button to send the transmission on Saturday.

## 2018-07-17 NOTE — Telephone Encounter (Signed)
Transmission from 07/15/18 reviewed. Normal device function. 1 NSVT episode on 07/13/18, 4 beats duration, appears brief conducted AT (1:1). Presenting rhythm NSR at 82bpm. 0% A paced, 0% V paced.

## 2018-07-17 NOTE — Telephone Encounter (Signed)
New Message    PT is calling because she says she had a spell, her heart starts beating fast and she gets a strange feeling in her body. She says she feels like she has to go pee. And it makes her feel really weak.   Please call back

## 2018-07-17 NOTE — Telephone Encounter (Signed)
LVM for return call. 

## 2018-07-17 NOTE — Telephone Encounter (Signed)
Follow up ° ° ° ° °Please return call to patient °

## 2018-07-17 NOTE — Telephone Encounter (Signed)
Pt calling with c/o her heart racing and her body tingling this past weekend. At that times she states she was looking out the window and she believes her HR increased to 130bpm. She felt her "whole body tingle like it was going to get shocked." She then went into the bedroom and sent in a remote check. Her remote check report is below and reviewed with pt.   I advised pt I would discuss this with Dr Graciela Husbands upon his return to the office for further advisement. She verbalized understanding and will await my return call.

## 2018-07-19 ENCOUNTER — Encounter: Payer: Medicare Other | Admitting: Internal Medicine

## 2018-07-19 ENCOUNTER — Telehealth: Payer: Self-pay | Admitting: Cardiology

## 2018-07-19 NOTE — Telephone Encounter (Signed)
Patient called and wanted to make sure she can wear her apple watch. After consulting w/ the device tech nurse I informed pt that she can wear the watch just to be sure to keep it at least 6 inches away from the device. Pt also wanted to know what kind alert tone her device would. I instructed pt that her device will beep faintly if her battery is low. Pt verbalized understanding.

## 2018-07-19 NOTE — Telephone Encounter (Signed)
Per Dr Graciela Husbands, pt can have an event monitor placed to rule out any arrythmias that her device may not be picking up. Pt agrees with having a monitor placed. I will message scheduling to set up.

## 2018-07-20 ENCOUNTER — Other Ambulatory Visit: Payer: Self-pay

## 2018-07-31 ENCOUNTER — Ambulatory Visit (INDEPENDENT_AMBULATORY_CARE_PROVIDER_SITE_OTHER): Payer: Medicare Other | Admitting: *Deleted

## 2018-07-31 DIAGNOSIS — I472 Ventricular tachycardia, unspecified: Secondary | ICD-10-CM

## 2018-07-31 DIAGNOSIS — I421 Obstructive hypertrophic cardiomyopathy: Secondary | ICD-10-CM

## 2018-08-01 ENCOUNTER — Telehealth: Payer: Self-pay

## 2018-08-01 LAB — CUP PACEART REMOTE DEVICE CHECK
Battery Remaining Longevity: 36 mo
Battery Remaining Percentage: 41 %
Brady Statistic RA Percent Paced: 0 %
Brady Statistic RV Percent Paced: 0 %
Date Time Interrogation Session: 20200309081100
HighPow Impedance: 58 Ohm
Implantable Lead Implant Date: 19970916
Implantable Lead Implant Date: 19970916
Implantable Lead Location: 753859
Implantable Lead Location: 753860
Implantable Lead Model: 125
Implantable Lead Serial Number: 222062
Implantable Lead Serial Number: 279972
Implantable Pulse Generator Implant Date: 20101020
Lead Channel Pacing Threshold Amplitude: 1.1 V
Lead Channel Pacing Threshold Pulse Width: 0.4 ms
Lead Channel Pacing Threshold Pulse Width: 1 ms
Lead Channel Setting Pacing Amplitude: 2.6 V
Lead Channel Setting Pacing Pulse Width: 0.4 ms
Lead Channel Setting Sensing Sensitivity: 0.6 mV
MDC IDC MSMT LEADCHNL RA PACING THRESHOLD AMPLITUDE: 4.5 V
MDC IDC MSMT LEADCHNL RV IMPEDANCE VALUE: 451 Ohm
Pulse Gen Serial Number: 149386

## 2018-08-01 NOTE — Telephone Encounter (Signed)
Pt states that her power went out yesterday and she thought she missed her home remote appointment. Pt thinks when the power came back on maybe the transmitter sent every time she went near it.

## 2018-08-02 ENCOUNTER — Encounter: Payer: Self-pay | Admitting: Neurology

## 2018-08-02 ENCOUNTER — Ambulatory Visit (INDEPENDENT_AMBULATORY_CARE_PROVIDER_SITE_OTHER): Payer: Medicare Other | Admitting: Neurology

## 2018-08-02 ENCOUNTER — Other Ambulatory Visit: Payer: Self-pay

## 2018-08-02 VITALS — BP 103/65 | HR 62 | Ht 62.0 in | Wt 155.0 lb

## 2018-08-02 DIAGNOSIS — R42 Dizziness and giddiness: Secondary | ICD-10-CM | POA: Diagnosis not present

## 2018-08-02 NOTE — Progress Notes (Signed)
[facility]    Provider:  Dr Lucia Gaskins Requesting Provider: Sherryl Manges, MD Primary Care Provider:  Nelda Bucks, MD  CC:  Migraines  HPI:  Aimee Edwards is a 73 y.o. female here as requested by Dr. Graciela Husbands for intractable migraines.  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 She also has a history of migraines.  At that time he wondered whether her episodes of lightheadedness were neurologic in origin especially with her migraine headaches but it appears since adjusting medications these episodes have mostly resolved. Patient is here today and feels better since adjusting medication. When she made her appointment she was having lightheadedness and nausea. Dr. Graciela Husbands sent her here to evaluate for migraine aura but in the meantime they adjusted her medication Sotalol and these episodes resolved. She has a history of remote migraines. She has some episodes where she gets short of breath, her heart beats faster, her body feels heavy and feels like she will fall. It is worse when she stands up. Improved since medication management. No correlation with heart beat or any abnormalities. She is going to wear a heart monitor. Now it happens infrequently, Monday evening she had "just a tad" and 2 weeks prior she had her last one she had just stood up to put something in the microwave and she felt aweful, if she sits down it goes away and resolves. No loss of awareness, no loss of cosciousness, no headache, no abnormal movements. She hasn't had a migraine for decades and she never had any of these episodes in the past with her migraines. No symptoms today. No vision changes, no problems speaking or walking. The episodes were the worst Last November and much improved with medication management via Dr Graciela Husbands. If she stands  slowly and waits then she can prevent it. No other focal neurologic deficits, associated symptoms, inciting events or modifiable factors.   Reviewed notes, labs and imaging from outside physicians, which showed:  Reviewed notes from Dr. Graciela Husbands.  Patient's last appointment was July 10, 2018.  She was following up for hypertrophic cardiomyopathy status post septal myectomy and history of ICD implantation with concomitant atrial fibrillation resulting in inappropriate ICD discharges.  She is managed with anticoagulation.  She also has mitral regurgitation severe and moderate aortic insufficiency.  She was unable to tolerate sotalol secondary to spells of lightheadedness and weakness.  Feeling much better on the lower dose.  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  Blood pressures have been recorded in the low 100s and high 90s.  She also has a history of migraines.  At that time he wondered whether her episodes of lightheadedness were neurologic in origin especially with her migraine headaches but it appears as though since then decreasing the sotalol has helped.  Review of Systems: Patient complains of symptoms per HPI as well as the following symptoms: lightheaded, chest pain. Pertinent negatives and positives per HPI. All others negative.   Social History   Socioeconomic History   Marital status: Divorced    Spouse name: Not on file   Number of children: 3   Years of education: Not on file  Highest education level: Some college, no degree  Occupational History   Not on file  Social Needs   Financial resource strain: Not on file   Food insecurity:    Worry: Not on file    Inability: Not  on file   Transportation needs:    Medical: Not on file    Non-medical: Not on file  Tobacco Use   Smoking status: Never Smoker   Smokeless tobacco: Never Used  Substance and Sexual Activity   Alcohol use: Not Currently    Comment: "once in awhile"   Drug use: Never   Sexual activity: Not Currently  Lifestyle   Physical activity:    Days per week: Not on file    Minutes per session: Not on file   Stress: Not on file  Relationships   Social connections:    Talks on phone: Not on file    Gets together: Not on file    Attends religious service: Not on file    Active member of club or organization: Not on file    Attends meetings of clubs or organizations: Not on file    Relationship status: Not on file   Intimate partner violence:    Fear of current or ex partner: Not on file    Emotionally abused: Not on file    Physically abused: Not on file    Forced sexual activity: Not on file  Other Topics Concern   Not on file  Social History Narrative   Lives at home alone    Right handed   Caffeine: quit Jan 2020    Family History  Problem Relation Age of Onset   Alzheimer's disease Mother    Emphysema Father    Diabetes Father    Heart disease Brother    Cancer Brother    Diabetes Paternal Grandmother    Cardiomyopathy Other        several in family have had this     Past Medical History:  Diagnosis Date   Anxiety    Atrial fibrillation (HCC)    Back pain    Cardiomyopathy, hypertrophic obstructive (HCC)    Complication of anesthesia    Degeneration of cervical intervertebral disc    Dyslipidemia    Edema    Gastroesophageal reflux disease    Hernia, abdominal    Hypokalemia    ICD (implantable cardioverter-defibrillator) in place    Mitral regurgitation    Pinched nerve    PONV (postoperative nausea and vomiting)    Presence of permanent cardiac pacemaker    Reflux    Right shoulder pain    Shortness of breath on  exertion    Sleep apnea     Patient Active Problem List   Diagnosis Date Noted   VT (ventricular tachycardia) (HCC) 06/22/2018   Right shoulder pain 02/17/2016   Pain in neck 02/17/2016   Degeneration of cervical intervertebral disc 02/17/2016   Other specific arthropathies, not elsewhere classified, unspecified shoulder 02/17/2016   Menopause 02/17/2016   OP (osteoporosis) 02/17/2016   Rotator cuff (capsule) sprain and strain 02/17/2016   Hypokalemia 02/17/2016   Hypertrophic obstructive cardiomyopathy (HCC) 02/17/2016   AICD (automatic cardioverter/defibrillator) present 02/17/2016   Trochanteric tendinitis 02/17/2016   Pain in soft tissues of limb 02/17/2016   Other synovitis and tenosynovitis, unspecified site 02/17/2016   OSA (obstructive sleep apnea) 02/17/2016   Mitral regurgitation 02/17/2016   Hypertrophic cardiomyopathy (HCC) 02/17/2016    Past Surgical History:  Procedure Laterality Date  CARDIAC CATHETERIZATION     COLONSCOPY     CORONARY ARTERY BYPASS GRAFT     pt denies this. She said she never had bypass surgery.   ICD GENERATOR CHANGE     ICD/BIV ICD FUNCTION(DFT) TEST N/A 06/26/2018   Procedure: ICD/BIV ICD FUNCTION (DFT) TEST;  Surgeon: Marinus Maw, MD;  Location: [facility];  Service: Cardiovascular;  Laterality: N/A;   MYOMECTOMY     OTHER HEART/PERICARD OPS     PACEMAKER INSERTION     TONSILLECTOMY     VAGINAL HYSTERECTOMY      Current Outpatient Medications  Medication Sig Dispense Refill   atorvastatin (LIPITOR) 40 MG tablet Take 1 tablet (40 mg total) by mouth daily at 6 PM. 90 tablet 3   magnesium oxide (MAG-OX) 400 MG tablet Take 1 tablet (400 mg total) by mouth daily. 90 tablet 3   sotalol (BETAPACE) 80 MG tablet Take 0.5 tablets (40 mg total) by mouth every 12 (twelve) hours. 60 tablet 6   spironolactone (ALDACTONE) 25 MG tablet Take 0.5 tablets (12.5 mg total) by mouth daily. 90 tablet 3   warfarin  (COUMADIN) 5 MG tablet Take 1 tablet (5 mg) by mouth daily as directed by the coumadin clinic (Patient taking differently: Take 5 mg by mouth at bedtime. ) 30 tablet 2   aspirin EC 81 MG tablet Take 81 mg by mouth daily.     No current facility-administered medications for this visit.     Allergies as of 08/02/2018 - Review Complete 08/02/2018  Allergen Reaction Noted   Codeine Nausea And Vomiting 02/17/2016   Lortab [hydrocodone-acetaminophen] Nausea And Vomiting 02/17/2016    Vitals: BP 103/65 (BP Location: Left Arm, Patient Position: Sitting)    Pulse 62    Ht  (1.575 m)    Wt 155 lb (70.3 kg)    BMI 28.35 kg/m  Last Weight:  Wt Readings from Last 1 Encounters:  08/02/18 155 lb (70.3 kg)   Last Height:   Ht Readings from Last 1 Encounters:  08/02/18  (1.575 m)     Physical exam: Exam: Gen: NAD, conversant, well nourised, obese, well groomed                     CV: RRR, +SEM. No Carotid Bruits. No peripheral edema, warm, nontender Eyes: Conjunctivae clear without exudates or hemorrhage  Neuro: Detailed Neurologic Exam  Speech:    Speech is normal; fluent and spontaneous with normal comprehension.  Cognition:    The patient is oriented to person, place, and time;     recent and remote memory intact;     language fluent;     normal attention, concentration,     fund of knowledge Cranial Nerves:    The pupils are equal, round, and reactive to light.  Attempted for funduscopy could not visualize due to small pupils. Visual fields are full to finger confrontation. Extraocular movements are intact. Trigeminal sensation is intact and the muscles of mastication are normal. The face is symmetric. The palate elevates in the midline. Hearing intact. Voice is normal. Shoulder shrug is normal. The tongue has normal motion without fasciculations.   Coordination:    Normal finger to nose and heel to shin. Normal rapid alternating movements.   Gait:    Heel-toe and  tandem gait are normal.   Motor Observation:    No asymmetry, no atrophy, and no involuntary movements noted. Tone:    Normal muscle tone.  Posture:    Posture is normal. normal erect    Strength:    Strength is V/V in the upper and lower limbs.      Sensation: intact to LT     Reflex Exam:  DTR's:    Deep tendon reflexes in the upper and lower extremities are symmetrical bilaterally.   Toes:    The toes are downgoing bilaterally.   Clonus:    Clonus is absent.    Assessment/Plan:  Lovely 73 y.o. female here as requested by Dr. Graciela Husbands for possible migraines. She has not had a migraine in 50 years.  Past medical history atrial fibrillation, hypertrophic obstructive cardiomyopathy s/p myectomy,  ICD implantation, mitral regurgitation, sleep apnea. Neurologic exam non focal   -This is a lovely patient.  She was having significant episodes of lightheadedness, nausea, tachycardia, feelings of a heavy body.  These episodes were more prevalent when changing positions or standing.  It appears Dr. Graciela Husbands made changes to some of her medications including her beta-blocker and episodes significantly improved.  Also if she stands slowly and weights she can prevent it and sitting helps. She is having minor episodes and a Holter monitor is pending.  -She has a history of migraines however has not had a migraine in 50 years and never had symptoms such as this with her migraines.  I think it would be highly unlikely that her symptoms are related to migraine aura without head pain.  -At this point I am not concerned for seizures, stroke, TIA or migraine aura.  Medication management significantly improved her episodes.  I do not feel that further neurologic work-up is warranted at this time however if she continues to have milder symptoms and Holter monitor is negative and medical management does not completely resolve them then we can consider further testing such as MRI of the brain.  - Continue to  follow Carotid Dopplers   Cc: Nelda Bucks, MD,  Dr. Zollie Pee, MD  [facility] [address] [place], [place] [address]  Phone [phone] Fax [phone]

## 2018-08-02 NOTE — Patient Instructions (Addendum)

## 2018-08-07 ENCOUNTER — Encounter: Payer: Medicare Other | Admitting: Internal Medicine

## 2018-08-07 NOTE — Progress Notes (Signed)
Remote ICD transmission.   

## 2018-08-11 ENCOUNTER — Telehealth: Payer: Self-pay | Admitting: Internal Medicine

## 2018-08-11 NOTE — Telephone Encounter (Signed)
New Message:    Pt called wanted to know if she still  Should come in Monday for her Monitor?

## 2018-08-15 ENCOUNTER — Telehealth: Payer: Self-pay | Admitting: Radiology

## 2018-08-15 NOTE — Telephone Encounter (Signed)
Event monitor was enrolled to be mailed on 3-20 due to covid-19 

## 2018-08-18 NOTE — Telephone Encounter (Signed)
Patient had monitor mailed to her on 3-24

## 2018-08-23 NOTE — Telephone Encounter (Signed)
Spoke with pt regarding her swelling and SOB. She states she is feeling much better, but has noticed she feels SOB and swollen at the end of the day. She was taken off lasix at discharge during her last hospital stay due to hypotension. Pt states her BP has been running "normal." She does not believe she is in AF.  I advised pt to take a dose of her lasix, 40mg  tablet, today. She will call tomorrow with an update on whether this helped her sx. If so, Dr Graciela Husbands may want to place her back on lasix to manage fluid. She verbalized understanding and has no additional questions or needs.

## 2018-08-23 NOTE — Telephone Encounter (Signed)
  Patient is calling because she has not received her monitor yet. She also wants to let Dr Graciela Husbands know that she is having some leg swelling and she has been taken off her fluid pills so she hasn't taken one. Can she take one? She c/o sob upon exertion.

## 2018-08-23 NOTE — Telephone Encounter (Signed)
Spoke with [employer] to inquire why patient never received her cardiac event monitor.  [employer] inadvertently shipped the monitor to [facility] instead.  We are to disregard the serial number on her enrollment.  [employer] will overnight ship another  Monitor today.

## 2018-08-24 ENCOUNTER — Telehealth: Payer: Self-pay | Admitting: Internal Medicine

## 2018-08-24 MED ORDER — FUROSEMIDE 40 MG PO TABS: 40.0000 mg | ORAL_TABLET | ORAL | 2 refills | Status: DC

## 2018-08-24 NOTE — Telephone Encounter (Signed)
Pt states she has lost 3lbs after taking 40mg  lasix yesterday. She feels so much better and does not feel SOB. Per Dr Graciela Husbands, he would like her to begin taking 40mg  Lasix, two times per week. Pt was instructed to call back if she notices increase SOB or swelling in between her 2 doses. Dr Graciela Husbands may increase it at this time. She has verbalized understanding and has no additional questions.

## 2018-08-24 NOTE — Telephone Encounter (Signed)
New message   Patient would like to receive a callback in reference taking furosemide 40 mg pill. Patient states that she spoke with you on yesterday.

## 2018-08-25 ENCOUNTER — Encounter (INDEPENDENT_AMBULATORY_CARE_PROVIDER_SITE_OTHER): Payer: Medicare Other

## 2018-08-25 DIAGNOSIS — R002 Palpitations: Secondary | ICD-10-CM

## 2018-08-28 ENCOUNTER — Encounter: Payer: Self-pay | Admitting: Internal Medicine

## 2018-08-29 ENCOUNTER — Telehealth: Payer: Self-pay | Admitting: Internal Medicine

## 2018-08-29 NOTE — Telephone Encounter (Signed)
New Message              John from [employer] is calling because of a critical EKG. Pls call to advise

## 2018-08-29 NOTE — Telephone Encounter (Signed)
I have received 2 faxes from [employer] - one At Flutter rate 120 bpm on 4/6 at 9:17 pm.  Pt reports feeling tired.  The other fax demonstrated a 7 beat run of V-tach 4/6 at 4:46 pm.  Pt reports she was working in her [employer].  She "felt funny" sat down and rest.  She then returned to her baseline.  Both faxes reviewed with Dr Eden Emms.  No new orders given.  Will place in Dr [place] Yousra Ivens box for review.

## 2018-08-30 NOTE — Telephone Encounter (Signed)
° ° °  Patient calling to follow up on 4/7 telephone note. Please call

## 2018-08-30 NOTE — Telephone Encounter (Signed)
Presenting rhythm shows SR w/PACs at 87bpm. Device is programmed VVI, so it will not record/detect atrial arrhythmias. 11 NSVT episodes noted, most recent on 08/28/18 at 1746 (time may be up to an hour off due to daylight savings). No treated VT/VF episodes.

## 2018-08-30 NOTE — Telephone Encounter (Signed)
Spoke with pt today regarding her monitor alert. I advised her to send in a remote transmission so we might note how long her episode of AFL was (since this triggered a shock in the past.) She will do so today.   Pt states she still has sx of heart flutters and SOB. When this happens she sits down. She states this happens often. I reminded her to use her sx trigger on her [employer] monitor so as to correlate her sx with rhythm. She verbalized she would do so.   Pt understands we will call her back with any recommendation regarding her remote transmission as well.

## 2018-08-31 NOTE — Telephone Encounter (Signed)
Late Entry:  Pt is aware of her remote transmission results and her ICD settings. She understands to use her symptom activator on her monitor to identify rhythm to symptoms. We will call her when the monitor has been completed and interpreted, unless another critical is identified.

## 2018-09-06 NOTE — Telephone Encounter (Signed)
Will call tomorrow The strips are noted as auto detect ie not symptom activated  I will clarify thnks

## 2018-09-08 NOTE — Telephone Encounter (Signed)
Called re strips LEFT VM and will call back later

## 2018-09-13 ENCOUNTER — Other Ambulatory Visit: Payer: Self-pay

## 2018-09-13 ENCOUNTER — Telehealth: Payer: Self-pay | Admitting: Internal Medicine

## 2018-09-13 NOTE — Telephone Encounter (Signed)
STAT if HR is under 50 or over 120 (normal HR is 60-100 beats per minute)  1) What is your heart rate? She did not know the rate- It is just beating hard and fast  She says it is beating hard and fast 2) Do you have a log of your heart rate readings (document readings)? no  3) Do you have any other symtoms? She just does not feel right- her blood pressure was 129/75

## 2018-09-13 NOTE — Telephone Encounter (Signed)
katy or shelli  Can we get a look at the triggered event please   Thanks   Brett Canales

## 2018-09-13 NOTE — Telephone Encounter (Signed)
Pt sent in unscheduled manual ICD transmission today at 09:25. Presenting rhythm: SR @ 69bpm w/PACs. Since last manual transmission on 08/30/18, pt has had 3 "NSVT" episodes--EGMs suggest 1:1 SVT and true NSVT. No recent sustained or treated episodes.

## 2018-09-13 NOTE — Telephone Encounter (Signed)
thx

## 2018-09-13 NOTE — Telephone Encounter (Signed)
Called [employer] at the time the patient sent an event she was in NSR with a rate of 63. They are uploading the report now. I will email it to you.

## 2018-09-13 NOTE — Telephone Encounter (Signed)
Spoke with pt who state she just had another "episode". Pt reports that during this episode she started to have chest palpitations, SOB, urge to use the restroom and dizziness. Pt denies having CP, nausea and sweating. Pt reports that this lasted 30 min. She is currently wearing heart monitor and did hit the record button. Please advise

## 2018-09-14 NOTE — Telephone Encounter (Signed)
Reviewed strips  From ydays event -- strips were normal  Weird like sensation  Weak palpitations, only when occurs standing and the kitchen   ? Orthostatic  Will have her check BP and HR sitting and standing

## 2018-10-10 NOTE — Telephone Encounter (Signed)
LVM for return call. 

## 2018-10-12 NOTE — Telephone Encounter (Signed)
Spoke with pt and she agreed to a telephone visit to discuss recent symptoms of irregular heart beats and dizziness. She states she went to her PCP yesterday with hematuria, negative UTI, INR > 5. She will be reporting back to her her PCP for a follow up INR check next week.

## 2018-10-13 ENCOUNTER — Other Ambulatory Visit: Payer: Self-pay

## 2018-10-13 ENCOUNTER — Telehealth (INDEPENDENT_AMBULATORY_CARE_PROVIDER_SITE_OTHER): Payer: Medicare Other | Admitting: Internal Medicine

## 2018-10-13 ENCOUNTER — Encounter: Payer: Self-pay | Admitting: Internal Medicine

## 2018-10-13 VITALS — Ht 62.0 in | Wt 153.0 lb

## 2018-10-13 DIAGNOSIS — I472 Ventricular tachycardia, unspecified: Secondary | ICD-10-CM

## 2018-10-13 DIAGNOSIS — I480 Paroxysmal atrial fibrillation: Secondary | ICD-10-CM

## 2018-10-13 DIAGNOSIS — R002 Palpitations: Secondary | ICD-10-CM

## 2018-10-13 DIAGNOSIS — R42 Dizziness and giddiness: Secondary | ICD-10-CM

## 2018-10-13 DIAGNOSIS — Z95810 Presence of automatic (implantable) cardiac defibrillator: Secondary | ICD-10-CM

## 2018-10-13 DIAGNOSIS — I421 Obstructive hypertrophic cardiomyopathy: Secondary | ICD-10-CM

## 2018-10-13 MED ORDER — METOPROLOL SUCCINATE ER 25 MG PO TB24: 25.0000 mg | ORAL_TABLET | Freq: Every day | ORAL | 0 refills | Status: DC

## 2018-10-13 NOTE — Progress Notes (Signed)
Electrophysiology TeleHealth Note   Due to national recommendations of social distancing due to COVID 19, an audio/video telehealth visit is felt to be most appropriate for this patient at this time.  See MyChart message from today for the patient's consent to telehealth for [facility].   Date:  10/13/2018   ID:  Conley SimmondsVirginia D Edwards, DOB 05/14/1946, MRN [id number]  Location: patient's home  Provider location: [address], [place]  Evaluation Performed: Follow-up visit  PCP:  [name], [name], MD  Cardiologist:     Electrophysiologist:  SK   Chief Complaint:  Dizziness and HCM   History of Present Illness:    Aimee Edwards is a 73 y.o. female who presents via audio/video conferencing for a telehealth visit today.  Since last being seen in our clinic, the patient reports continues to have dizzy spells. Dizzy spells  Orthos VS  Normal (mychart 4/27) which she could not induce a "funny feeling "that precedes universally the dizzy spells that follow.  She does get this sensation occasionally showers.  It is mitigated by sitting down and can be aggravated by standing again. She does not identify any specific triggers.  Her Apple Watch reports normal heart rhythm and demonstrates normal heart rates.  She occasionally will take her blood pressure immediately upon sitting and will be in the 90s.  This is atypically low.  Event recorder was personally reviewed and most of her dizzy spells are associated with sinus rhythm.  She did have nonsustained VT as well as some atrial arrhythmia  Also had a spell last week that was consistent with vertigo accompanied by nausea and sensation of environmental spinning and lasted 24 hours as opposed to 5-10 minutes   Labile INR>> Hematuria; we have had discussions regarding NOACs  The patient denies symptoms of fevers, chills, cough, or new SOB worrisome for COVID 19.    Past Medical History:  Diagnosis Date  . Anxiety   .  Atrial fibrillation (HCC)   . Back pain   . Cardiomyopathy, hypertrophic obstructive (HCC)   . Complication of anesthesia   . Degeneration of cervical intervertebral disc   . Dyslipidemia   . Edema   . Gastroesophageal reflux disease   . Hernia, abdominal   . Hypokalemia   . ICD (implantable cardioverter-defibrillator) in place   . Mitral regurgitation   . Pinched nerve   . PONV (postoperative nausea and vomiting)   . Presence of permanent cardiac pacemaker   . Reflux   . Right shoulder pain   . Shortness of breath on exertion   . Sleep apnea     Past Surgical History:  Procedure Laterality Date  . CARDIAC CATHETERIZATION    . COLONSCOPY    . CORONARY ARTERY BYPASS GRAFT     pt denies this. She said she never had bypass surgery.  . ICD GENERATOR CHANGE    . ICD/BIV ICD FUNCTION(DFT) TEST N/A 06/26/2018   Procedure: ICD/BIV ICD FUNCTION (DFT) TEST;  Surgeon: Marinus Mawaylor, Gregg W, MD;  [facility];  Service: Cardiovascular;  Laterality: N/A;  . MYOMECTOMY    . OTHER HEART/PERICARD OPS    . PACEMAKER INSERTION    . TONSILLECTOMY    . VAGINAL HYSTERECTOMY      Current Outpatient Medications  Medication Sig Dispense Refill  . atorvastatin (LIPITOR) 40 MG tablet Take 1 tablet (40 mg total) by mouth daily at 6 PM. 90 tablet 3  . furosemide (LASIX) 40 MG  tablet Take 1 tablet (40 mg total) by mouth 2 (two) times a week. 30 tablet 2  . magnesium oxide (MAG-OX) 400 MG tablet Take 1 tablet (400 mg total) by mouth daily. 90 tablet 3  . sotalol (BETAPACE) 80 MG tablet Take 0.5 tablets (40 mg total) by mouth every 12 (twelve) hours. 60 tablet 6  . spironolactone (ALDACTONE) 25 MG tablet Take 0.5 tablets (12.5 mg total) by mouth daily. 90 tablet 3  . warfarin (COUMADIN) 5 MG tablet Take 1 tablet (5 mg) by mouth daily as directed by the coumadin clinic (Patient taking differently: Take 5 mg by mouth at bedtime. ) 30 tablet 2   No current facility-administered medications for  this visit.     Allergies:   Codeine and Lortab [hydrocodone-acetaminophen]   Social History:  The patient  reports that she has never smoked. She has never used smokeless tobacco. She reports previous alcohol use. She reports that she does not use drugs.   Family History:  The patient's   family history includes Alzheimer's disease in her mother; Cancer in her brother; Cardiomyopathy in an other family member; Diabetes in her father and paternal grandmother; Emphysema in her father; Heart disease in her brother.   ROS:  Please see the history of present illness.   All other systems are personally reviewed and negative.    Exam:    Vital Signs:  Ht  (1.575 m)   Wt 153 lb (69.4 kg)   BMI 27.98 kg/m     Well appearing, alert and conversant, regular work of breathing,  good skin color Eyes- anicteric, neuro- grossly intact, skin- no apparent rash or lesions or cyanosis, mouth- oral mucosa is pink   Labs/Other Tests and Data Reviewed:    Recent Labs: 01/03/2018: ALT 17; Hemoglobin 12.3; Platelets 270 06/27/2018: Magnesium 2.1 07/03/2018: BUN 13; Creatinine, Ser 0.84; Potassium 4.3; Sodium 142   Wt Readings from Last 3 Encounters:  10/13/18 153 lb (69.4 kg)  08/02/18 155 lb (70.3 kg)  07/10/18 151 lb 12.8 oz (68.9 kg)     Other studies personally reviewed: Additional studies/ records that were reviewed today include: *As above  Review of the above records today demonstrates As above Prior radiograph   The patient presents wearable device technology report for my review today.  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 Indication primary prevention HCM .     Atrial fibrillation-paroxysmal-recurrent with inappropriate ICD shocks  Congestive heart failure-progressive-class III  Mitral regurgitation/aortic regurgitation question severity moderate  Labile INR  Atrial lead failure with over sensing  Dizzy spells/Nausea.  Review of her history strongly supports a diagnosis of orthostatic hypotension.  Not withstanding the lack of objective data, we will treated empirically.  We discussed extensively the physiology and reviewed the use of pharmacological and nonpharmacological treatments; we are both inclined towards the latter and I recommended 6 inch abdominal binder and thigh sleeves.  We will need to wait and see clinically how she responds.  Alternatively, ProAmatine might be of some value; fludrocortisone would likely be problematic given her propensity towards edema  She also has had atrial fibrillation with  inappropriate shocks.  She was unable to tolerate high-dose sotalol and so would like to try adding back metoprolol succinate to her low-dose sotalol to provide some protection.  I would have a relatively low threshold for AV junction ablation  Sotalol will be maintained because of issues with DFT     COVID 19 screen The patient denies symptoms of COVID 19 at this time.  The importance of social distancing was discussed today.  Follow-up:  3w  My chart   OV 59m Next remote:As Scheduled   Current medicines are reviewed at length with the patient today.   The patient does not have concerns regarding her medicines.  The following changes were made today: Begin metoprolol succinate 25 mg at bedtime   Labs/ tests ordered today include:   No orders of the defined types were placed in this encounter.    Patient Risk:  after full review of this patients clinical status, I feel that they are at moderate risk at this time.  Today, I have spent  24 minutes with the patient with telehealth technology discussing the above.  Signed, Sherryl Manges, MD  10/13/2018 4:08 PM     [facility] [address] [place] [address] [phone] (office) [phone] (fax)

## 2018-10-24 ENCOUNTER — Other Ambulatory Visit: Payer: Self-pay | Admitting: Internal Medicine

## 2018-10-24 MED ORDER — SOTALOL HCL 80 MG PO TABS: 40.0000 mg | ORAL_TABLET | Freq: Two times a day (BID) | ORAL | 3 refills | Status: DC

## 2018-10-30 ENCOUNTER — Ambulatory Visit (INDEPENDENT_AMBULATORY_CARE_PROVIDER_SITE_OTHER): Payer: Medicare Other | Admitting: *Deleted

## 2018-10-30 DIAGNOSIS — I472 Ventricular tachycardia, unspecified: Secondary | ICD-10-CM

## 2018-10-30 DIAGNOSIS — I421 Obstructive hypertrophic cardiomyopathy: Secondary | ICD-10-CM

## 2018-10-31 ENCOUNTER — Telehealth: Payer: Self-pay

## 2018-10-31 LAB — CUP PACEART REMOTE DEVICE CHECK
Battery Remaining Longevity: 30 mo
Battery Remaining Percentage: 36 %
Brady Statistic RA Percent Paced: 0 %
Brady Statistic RV Percent Paced: 0 %
Date Time Interrogation Session: 20200609161737
HighPow Impedance: 60 Ohm
Implantable Lead Implant Date: 19970916
Implantable Lead Implant Date: 19970916
Implantable Lead Location: 753859
Implantable Lead Location: 753860
Implantable Lead Model: 125
Implantable Lead Model: 4269
Implantable Lead Serial Number: 222062
Implantable Lead Serial Number: 279972
Implantable Pulse Generator Implant Date: 20101020
Lead Channel Impedance Value: 457 Ohm
Lead Channel Setting Pacing Amplitude: 2.6 V
Lead Channel Setting Pacing Pulse Width: 0.4 ms
Lead Channel Setting Sensing Sensitivity: 0.6 mV
Pulse Gen Serial Number: 149386

## 2018-10-31 NOTE — Telephone Encounter (Signed)
Left message for patient to remind of missed remote transmission.  

## 2018-11-06 NOTE — Progress Notes (Signed)
Remote ICD transmission.   

## 2018-11-13 NOTE — Telephone Encounter (Signed)
Called pt today to clarify her my chart message.   She states her SBP has been running in the 113 range, HR between 60-75. Her SpO2 is 95-98 showing a HR in the 60's. Today she does not feel well, similar to when she has had her "spells" in the past. She has no energy and feels SOB.  I have advised her to take an extra lasix this week to see if this helps her SOB. Her BP's and HR's look good. We reviewed her last remote check which showed episodes of NSVT which she believes accompanies her "spells." She is nervous something is about to happen because she doesn't feel right today.  I have encouraged her to try and find a way to relieve her anxiety. She is nervous about the defib going off again. I reviewed all her medications with her and talked with her about how great her vitals have been looking. She asked about blood sugars and I advised her to speak with her PCP regarding those.  Pt is going to send in manual transmission tonight to be sure everything looks okay. She believes this can help ease her worry. She understands I will call her back with her results tomorrow.  She verbalized understanding and has no additional questions.

## 2018-11-14 NOTE — Telephone Encounter (Signed)
Manual transmission received 11/13/18 at 17:21. 3 non-sustained episodes since last transmission on 10/31/18. 2 most recent episodes on 6/19 suggest both NSVT (3 beats) and 1:1 SVT (5 beats). No sustained or treated episodes. V rate histograms appropriate.

## 2018-11-17 NOTE — Telephone Encounter (Signed)
V rate histograms appear appropriate (programmed VVI due to atrial lead issues). Available non-sustained episodes suggest both NSVT and 1:1 SVT, longest NSVT 5 beats duration. No treated episodes. See examples below.

## 2018-11-27 ENCOUNTER — Telehealth: Payer: Self-pay | Admitting: Student

## 2018-11-27 NOTE — Telephone Encounter (Signed)
LMOM to discuss mychart message with symptoms.   Pt had episode of sharp pain in her head 11/24/2018. She sent device transmission. No episodes since 11/17/2018, nothing to correlate with her symptoms. Likely non-cardiac

## 2018-11-27 NOTE — Telephone Encounter (Signed)
Manual transmission from 11/24/18 at 13:09 reviewed, no episodes since 11/17/18 (4 beat run of NSVT). Oda Kilts, [place], attempted to reach pt to discuss symptoms, see separate phone note.

## 2019-01-09 ENCOUNTER — Encounter: Payer: Self-pay | Admitting: Internal Medicine

## 2019-01-09 ENCOUNTER — Other Ambulatory Visit: Payer: Self-pay

## 2019-01-09 ENCOUNTER — Ambulatory Visit (INDEPENDENT_AMBULATORY_CARE_PROVIDER_SITE_OTHER): Payer: Medicare Other | Admitting: Internal Medicine

## 2019-01-09 VITALS — BP 120/84 | HR 59 | Ht 62.0 in | Wt 152.6 lb

## 2019-01-09 DIAGNOSIS — E139 Other specified diabetes mellitus without complications: Secondary | ICD-10-CM | POA: Diagnosis not present

## 2019-01-09 DIAGNOSIS — I421 Obstructive hypertrophic cardiomyopathy: Secondary | ICD-10-CM

## 2019-01-09 DIAGNOSIS — I472 Ventricular tachycardia, unspecified: Secondary | ICD-10-CM

## 2019-01-09 DIAGNOSIS — Z95810 Presence of automatic (implantable) cardiac defibrillator: Secondary | ICD-10-CM

## 2019-01-09 LAB — CUP PACEART INCLINIC DEVICE CHECK
Battery Remaining Longevity: 30 mo
Brady Statistic RA Percent Paced: 0 %
Brady Statistic RV Percent Paced: 1 % — CL
Date Time Interrogation Session: 20200818040000
HighPow Impedance: 52 Ohm
HighPow Impedance: 59 Ohm
Implantable Lead Implant Date: 19970916
Implantable Lead Implant Date: 19970916
Implantable Lead Location: 753859
Implantable Lead Location: 753860
Implantable Lead Model: 125
Implantable Lead Model: 4269
Implantable Lead Serial Number: 222062
Implantable Lead Serial Number: 279972
Implantable Pulse Generator Implant Date: 20101020
Lead Channel Impedance Value: 382 Ohm
Lead Channel Impedance Value: 447 Ohm
Lead Channel Pacing Threshold Amplitude: 1.2 V
Lead Channel Pacing Threshold Pulse Width: 0.4 ms
Lead Channel Sensing Intrinsic Amplitude: 17.3 mV
Lead Channel Setting Pacing Amplitude: 2.6 V
Lead Channel Setting Pacing Pulse Width: 0.4 ms
Lead Channel Setting Sensing Sensitivity: 0.6 mV
Pulse Gen Serial Number: 149386

## 2019-01-09 NOTE — Progress Notes (Signed)
Patient Care Team: [name], [name], MD as PCP - General (Family Medicine)   HPI  Aimee Edwards is a 73 y.o. female Seen in follow-up for hypertrophic cardiomyopathy status post septal myectomy. She has a history of ICD implantation. She has had interval atrial fibrillation resulting in inappropriate ICD discharges.  Anticoagulation with warfarin  She has had problems with atrial lead with over sensing  DATE TEST EF   4/18 Echo   45-50 % Severe>>See Below --Moderate  11/19 Echo  60-65% Severe LAE  1/20 Echo  40.45%   1/20 LHC   Cors normal LVEDP 23   Date Cr K Hgb  8/19 0.75 4.3 12.3  06/22/18 1.09 3.6       She has a history of mitral regurgitation with comments suggesting severe MR and moderate AI.  Have reviewed with PR >> mod-central  Medical management recommended   [facility] of an ICD discharges.  She was seen by EP felt to have had ventricular tachycardia (agree See Below) and started on amiodarone.  Warfarin dose was appropriately decreased  Device was reprogrammed VVI because atrial lead issues.  When seen following discharge, it was noted that she had failed to convert with maximum output.  She was hospitalized and started on sotalol 80 mg twice daily.  She underwent DFT testing with defibrillation threshold less than 21 J.  She however has not been able to tolerate 80 sotalol twice daily secondary to spells of lightheadedness and weakness.  And we decreased it to 40 mg twice daily.  She is feeling much better.  Much  The patient denies chest pain, shortness of breath, nocturnal dyspnea, orthopnea or peripheral edema.  There have been no palpitations or syncope.  Lightheadedness with standing which I thought with orthostasis is much improved with vestibular therapy.  No bleeding on warfarin        Past Medical History:  Diagnosis Date  . Anxiety   . Atrial fibrillation (HCC)   . Back pain   . Cardiomyopathy, hypertrophic  obstructive (HCC)   . Complication of anesthesia   . Degeneration of cervical intervertebral disc   . Dyslipidemia   . Edema   . Gastroesophageal reflux disease   . Hernia, abdominal   . Hypokalemia   . ICD (implantable cardioverter-defibrillator) in place   . Mitral regurgitation   . Pinched nerve   . PONV (postoperative nausea and vomiting)   . Presence of permanent cardiac pacemaker   . Reflux   . Right shoulder pain   . Shortness of breath on exertion   . Sleep apnea     Past Surgical History:  Procedure Laterality Date  . CARDIAC CATHETERIZATION    . COLONSCOPY    . CORONARY ARTERY BYPASS GRAFT     pt denies this. She said she never had bypass surgery.  . ICD GENERATOR CHANGE    . ICD/BIV ICD FUNCTION(DFT) TEST N/A 06/26/2018   Procedure: ICD/BIV ICD FUNCTION (DFT) TEST;  Surgeon: Marinus Mawaylor, Gregg W, MD;  [facility];  Service: Cardiovascular;  Laterality: N/A;  . MYOMECTOMY    . OTHER HEART/PERICARD OPS    . PACEMAKER INSERTION    . TONSILLECTOMY    . VAGINAL HYSTERECTOMY      Current Outpatient Medications  Medication Sig Dispense Refill  . atorvastatin (LIPITOR) 40 MG tablet Take 1 tablet (40 mg total) by mouth daily at 6 PM. 90 tablet 3  . furosemide (LASIX)  40 MG tablet Take 20-40 mg by mouth daily.    . magnesium oxide (MAG-OX) 400 MG tablet Take 1 tablet (400 mg total) by mouth daily. 90 tablet 3  . meclizine (ANTIVERT) 25 MG tablet Take 1 tablet by mouth daily as needed for dizziness.    . metoprolol succinate (TOPROL XL) 25 MG 24 hr tablet Take 1 tablet (25 mg total) by mouth at bedtime. 90 tablet 0  . sotalol (BETAPACE) 80 MG tablet Take 0.5 tablets (40 mg total) by mouth every 12 (twelve) hours. 180 tablet 3  . spironolactone (ALDACTONE) 25 MG tablet Take 0.5 tablets (12.5 mg total) by mouth daily. 90 tablet 3  . warfarin (COUMADIN) 5 MG tablet Take 1 tablet (5 mg) by mouth daily as directed by the coumadin clinic (Patient taking differently:  Take 5 mg by mouth at bedtime. ) 30 tablet 2   No current facility-administered medications for this visit.     Allergies  Allergen Reactions  . Codeine Nausea And Vomiting  . Lortab [Hydrocodone-Acetaminophen] Nausea And Vomiting     Review of Systems negative except from HPI and PMH  Physical Exam   BP 120/84   Pulse (!) 59   Ht 5\' 2"  (1.575 m)   Wt 152 lb 9.6 oz (69.2 kg)   SpO2 97%   BMI 27.91 kg/m   Well developed and well nourished in no acute distress HENT normal Neck supple with JVP-flat Clear Device pocket well healed; without hematoma or erythema.  There is no tethering  Regular rate and rhythm, no  gallop No murmur Abd-soft with active BS No Clubbing cyanosis  edema Skin-warm and dry A & Oriented  Grossly normal sensory and motor function  ECG sinus  18/[birth year]/49      Assessment and  Plan Ventricular Tachycardia  Elevated defibrillation thresholds prompting the introduction of sotalol with adequate DFTs thereafter  Hypertrophic obstructive cardiomyopathy status post septal myectomy [place]? Indication The patient's device was interrogated.  The information was reviewed. No changes were made in the programming.   .     Atrial fibrillation-paroxysmal-recurrent with inappropriate ICD shocks  Congestive heart failure-progressive-class IIb  Mitral regurgitation/aortic regurgitation question severity moderate  Labile INR  Atrial lead failure with over sensing.  Orthostatic lightheadedness      Lightheadedness which I felt clinically was orthostasis, seems to have been vertigo with a significant improvement with vestibular therapy  Euvolemic continue current meds  On Anticoagulation;  No bleeding issues   No intercurrent Ventricular tachycardia  No intercurrent atrial fibrillation or flutter

## 2019-01-09 NOTE — Patient Instructions (Signed)
Medication Instructions:  Your physician recommends that you continue on your current medications as directed. Please refer to the Current Medication list given to you today.  Labwork: You will have labs drawn today: CBC, BMP, Mg, A1C   Testing/Procedures: None ordered.  Follow-Up: Your physician recommends that you schedule a follow-up appointment in:   6 months with Dr Caryl Comes  Any Other Special Instructions Will Be Listed Below (If Applicable).     If you need a refill on your cardiac medications before your next appointment, please call your pharmacy.

## 2019-01-10 LAB — BASIC METABOLIC PANEL
BUN/Creatinine Ratio: 22 (ref 12–28)
BUN: 16 mg/dL (ref 8–27)
CO2: 29 mmol/L (ref 20–29)
Calcium: 9.8 mg/dL (ref 8.7–10.3)
Chloride: 104 mmol/L (ref 96–106)
Creatinine, Ser: 0.72 mg/dL (ref 0.57–1.00)
GFR calc Af Amer: 96 mL/min/{1.73_m2} (ref >59)
GFR calc non Af Amer: 83 mL/min/{1.73_m2} (ref >59)
Glucose: 86 mg/dL (ref 65–99)
Potassium: 3.8 mmol/L (ref 3.5–5.2)
Sodium: 145 mmol/L — ABNORMAL HIGH (ref 134–144)

## 2019-01-10 LAB — CBC
Hematocrit: 39 % (ref 34.0–46.6)
Hemoglobin: 13.3 g/dL (ref 11.1–15.9)
MCH: 31.9 pg (ref 26.6–33.0)
MCHC: 34.1 g/dL (ref 31.5–35.7)
MCV: 94 fL (ref 79–97)
Platelets: 228 10*3/uL (ref 150–450)
RBC: 4.17 x10E6/uL (ref 3.77–5.28)
RDW: 13.2 % (ref 11.7–15.4)
WBC: 6.7 10*3/uL (ref 3.4–10.8)

## 2019-01-10 LAB — HEMOGLOBIN A1C
Est. average glucose Bld gHb Est-mCnc: 134 mg/dL
Hgb A1c MFr Bld: 6.3 % — ABNORMAL HIGH (ref 4.8–5.6)

## 2019-01-10 LAB — MAGNESIUM: Magnesium: 1.9 mg/dL (ref 1.6–2.3)

## 2019-01-26 ENCOUNTER — Other Ambulatory Visit: Payer: Self-pay | Admitting: Internal Medicine

## 2019-01-30 ENCOUNTER — Ambulatory Visit (INDEPENDENT_AMBULATORY_CARE_PROVIDER_SITE_OTHER): Payer: Medicare Other | Admitting: *Deleted

## 2019-01-30 DIAGNOSIS — I472 Ventricular tachycardia, unspecified: Secondary | ICD-10-CM

## 2019-01-31 LAB — CUP PACEART REMOTE DEVICE CHECK
Battery Remaining Longevity: 30 mo
Battery Remaining Percentage: 35 %
Brady Statistic RA Percent Paced: 0 %
Brady Statistic RV Percent Paced: 0 %
Date Time Interrogation Session: 20200909074308
HighPow Impedance: 59 Ohm
Implantable Lead Implant Date: 19970916
Implantable Lead Implant Date: 19970916
Implantable Lead Location: 753859
Implantable Lead Location: 753860
Implantable Lead Model: 125
Implantable Lead Model: 4269
Implantable Lead Serial Number: 222062
Implantable Lead Serial Number: 279972
Implantable Pulse Generator Implant Date: 20101020
Lead Channel Impedance Value: 448 Ohm
Lead Channel Pacing Threshold Amplitude: 1.2 V
Lead Channel Pacing Threshold Amplitude: 4 V
Lead Channel Pacing Threshold Pulse Width: 0.4 ms
Lead Channel Pacing Threshold Pulse Width: 1 ms
Lead Channel Setting Pacing Amplitude: 2.6 V
Lead Channel Setting Pacing Pulse Width: 0.4 ms
Lead Channel Setting Sensing Sensitivity: 0.6 mV
Pulse Gen Serial Number: 149386

## 2019-02-14 ENCOUNTER — Encounter: Payer: Self-pay | Admitting: Cardiology

## 2019-02-14 NOTE — Progress Notes (Signed)
Remote ICD transmission.   

## 2019-02-27 ENCOUNTER — Other Ambulatory Visit: Payer: Self-pay | Admitting: Internal Medicine

## 2019-03-17 IMAGING — CT CT ABDOMEN WO/W CM
2 of 10 series · 10 of 46 positions shown, 16 images · IV contrast (omnipaque)
Comparison: Abdominal ultrasound of 06/26/2018 and report from CT
chest dated 09/20/2002

CLINICAL DATA: Liver lesion for further characterization

EXAM:
CT ABDOMEN WITHOUT AND WITH CONTRAST
TECHNIQUE: Multidetector CT imaging of the abdomen was performed following the
standard protocol before and following the bolus administration of
intravenous contrast.
CONTRAST:  100mL OMNIPAQUE IOHEXOL 300 MG/ML  SOLN

[Series 7: arterial 2.0 cor · coronal · arterial · 0.47mm/px · 3 of 136 slices shown, 4 images]
[im 34/136  soft-tissue]
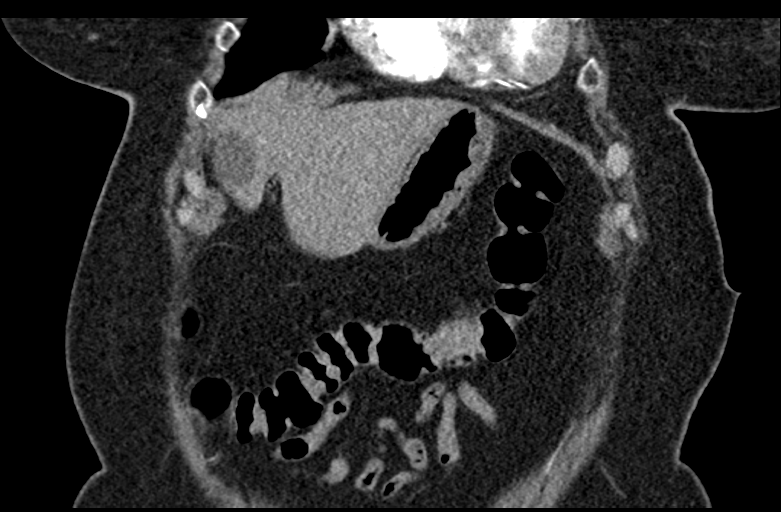
[im 68/136  soft-tissue]
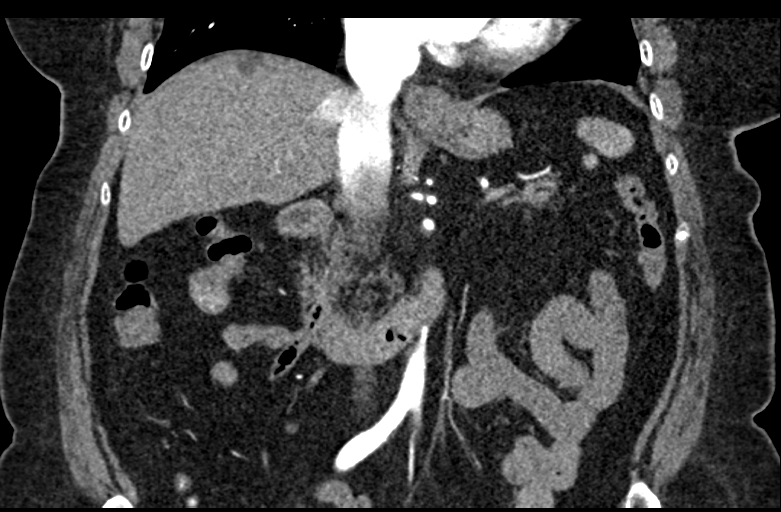
[im 68/136  bone]
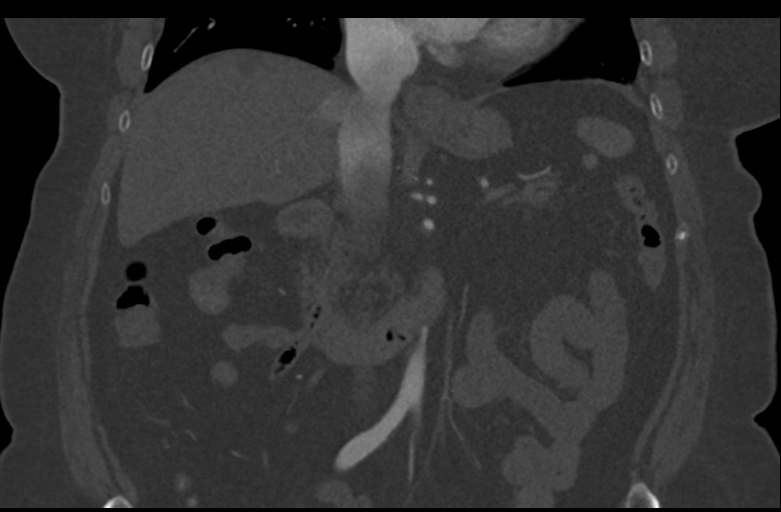
[im 102/136  soft-tissue]
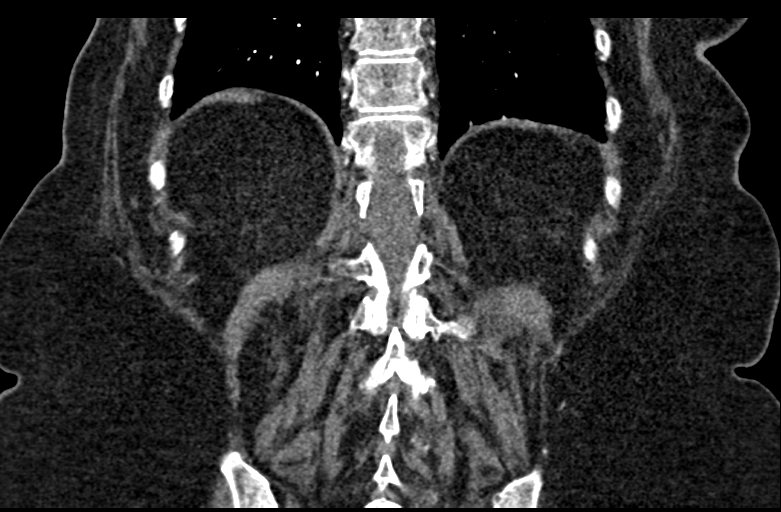

[Series 9: venous 3.0 · axial · portal-venous · 0.72mm/px · z∈[+1298,+1469]mm · 7 of 77 slices shown, 12 images]
[im 10/77  soft-tissue]
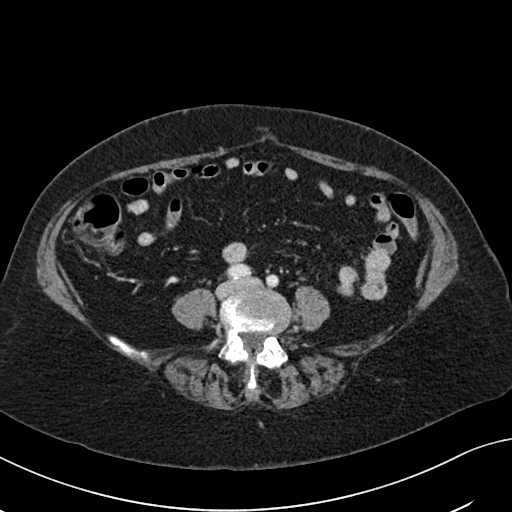
[im 10/77  bone]
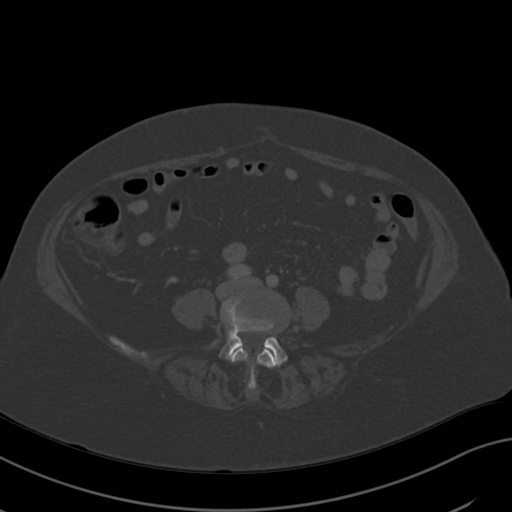
[im 20/77  soft-tissue]
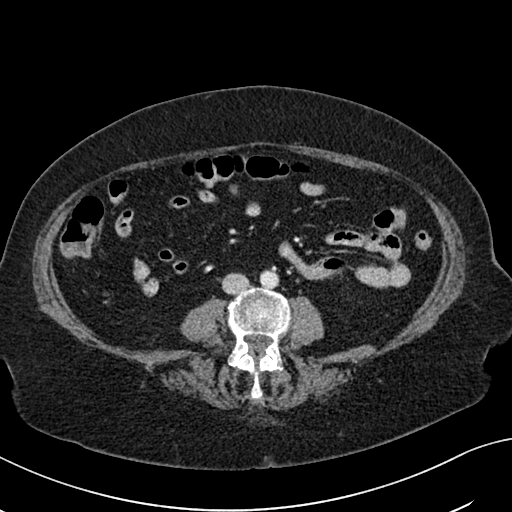
[im 29/77  soft-tissue]
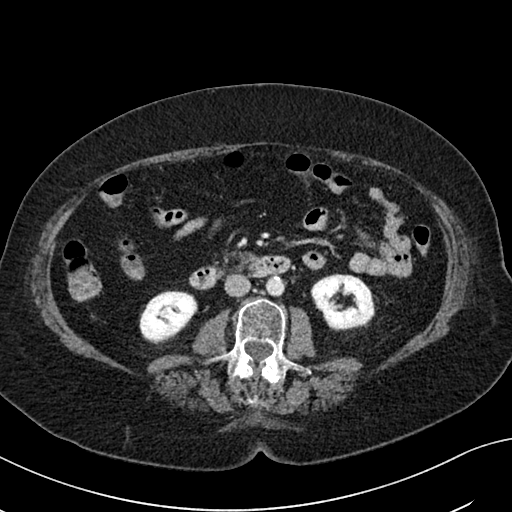
[im 39/77  soft-tissue]
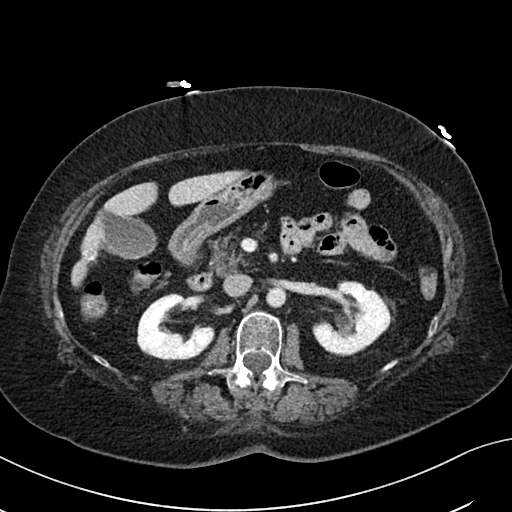
[im 39/77  lung]
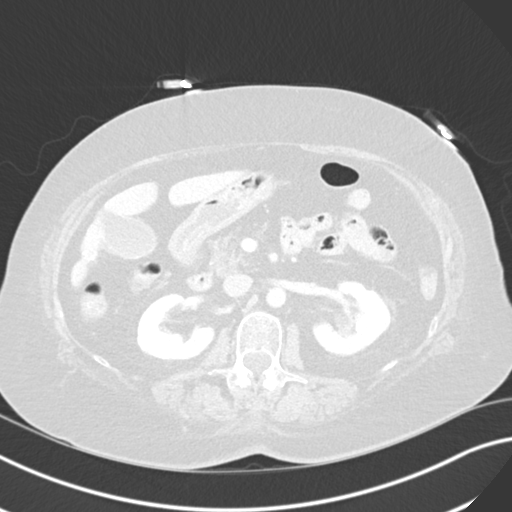
[im 48/77  soft-tissue]
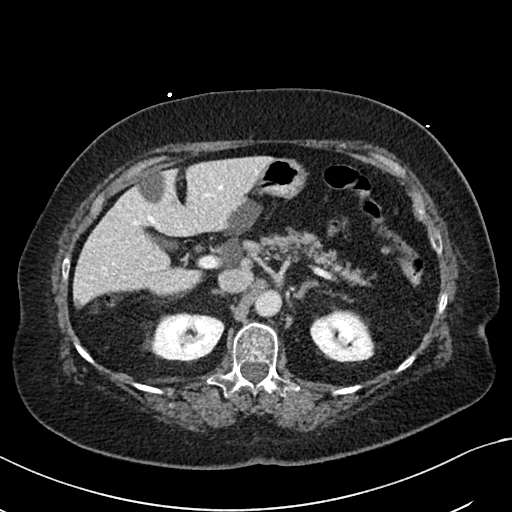
[im 48/77  lung]
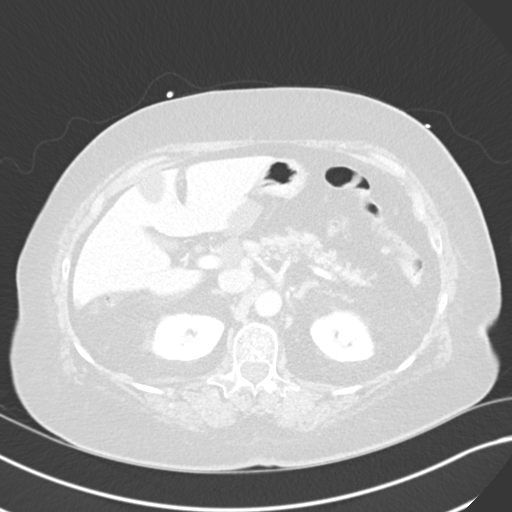
[im 58/77  soft-tissue]
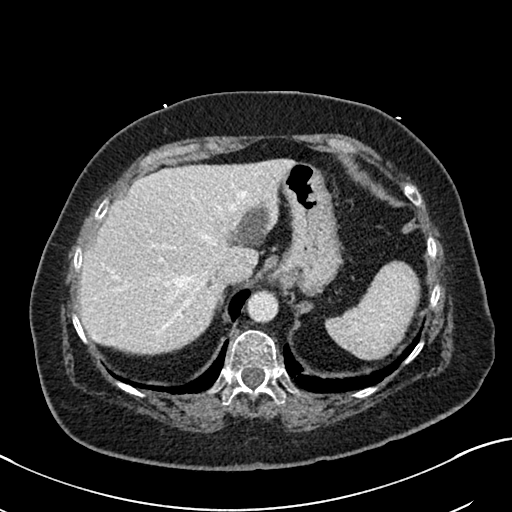
[im 58/77  lung]
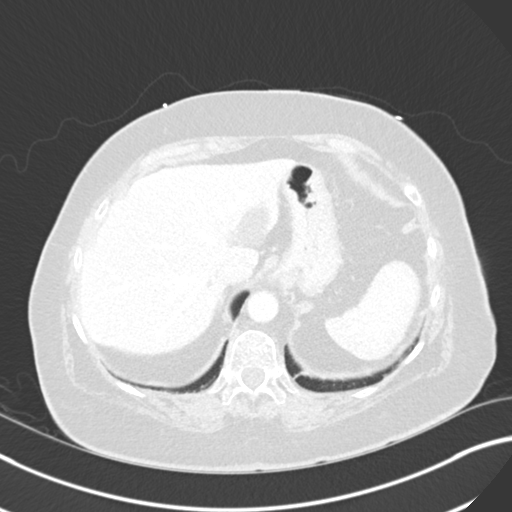
[im 67/77  soft-tissue]
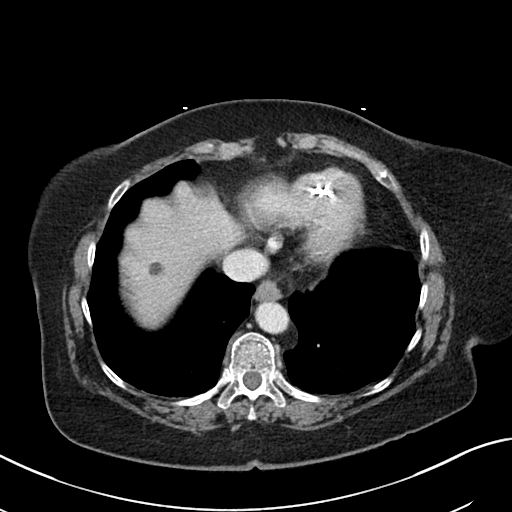
[im 67/77  lung]
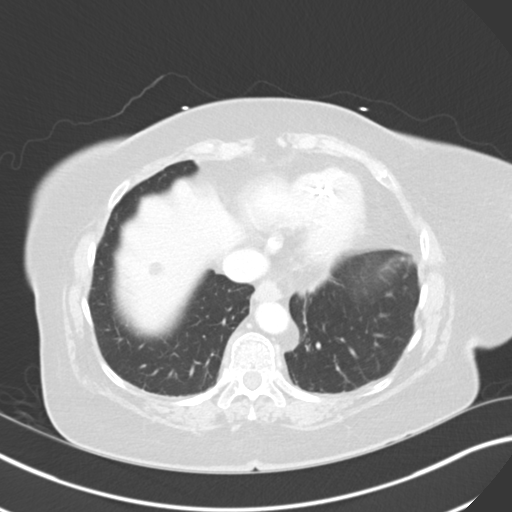

[10 of 46 positions shown; findings below may reference images not displayed]

FINDINGS: Lower chest: AICD lead noted along the right ventricle. Mild
scarring in the left lower lobe. Mild cardiomegaly. Small type 1
hiatal hernia.

Hepatobiliary: Multiple pole hepatic fluid density lesions are
present compatible with cysts. These were reported back in 2999.
Difficult to exclude mild septation versus adjacent cysts
posteriorly along the lateral segment left hepatic lobe and also
along the caudate lobe, but there is no internal nodularity or
associated calcifications. These lesions have generally benign
characteristics. No appreciable thick or nodular septation. No
measurable degree of enhancement. Gallbladder unremarkable. No
biliary dilatation.

Rim calcified 1.0 by 1.1 cm exophytic structure along the inferior
margin of the right hepatic lobe as shown on image [DATE], mild
internal complexity but no appreciable enhancement.

Pancreas: Unremarkable

Spleen: Unremarkable

Adrenals/Urinary Tract: No significant findings.

Stomach/Bowel: Unremarkable

Vascular/Lymphatic: Aortoiliac atherosclerotic vascular disease. No
pathologic adenopathy

Other: No supplemental non-categorized findings.

Musculoskeletal: Unremarkable
IMPRESSION: 1. The hepatic cysts have benign imaging characteristics. The
appearance of septation in the left hepatic lobe may simply be from
2 adjacent cysts, or a very thin septation in a single cyst, without
nodularity or abnormal enhancement. Multiple hepatic cysts were
noted on the prior CT chest of 09/20/2002.
2. There is a rim calcified 1.1 cm structure extending inferiorly
from the right hepatic lobe margin, probably either a focus of fat
necrosis or a chronic calcified cyst. This does not have appreciable
enhancement and is likely benign.
3.  Aortic Atherosclerosis ([id number]).
4. Mild cardiomegaly.
5. Small type 1 hiatal hernia.

## 2019-04-25 ENCOUNTER — Telehealth: Payer: Self-pay | Admitting: Internal Medicine

## 2019-04-25 NOTE — Telephone Encounter (Signed)
°  STAT if HR is under 50 or over 120 (normal HR is 60-100 beats per minute)  1) What is your heart rate? 121   2) Do you have a log of your heart rate readings (document readings)? 121, 106, 110, 121  3) Do you have any other symptoms? SOB    Pt is worried that her HR is normally in the 60s.   Pt c/o Shortness Of Breath: STAT if SOB developed within the last 24 hours or pt is noticeably SOB on the phone  1. Are you currently SOB (can you hear that pt is SOB on the phone)? Yes (Pt   2. How long have you been experiencing SOB?   3. Are you SOB when sitting or when up moving around? All the time  4. Are you currently experiencing any other symptoms? Rapid HR

## 2019-04-25 NOTE — Telephone Encounter (Signed)
Per device clinic: Nothing fast that she will get therapy for Looks like AFib, irregular   Called pt back to discuss further. Informed pt of information given by device clinic. Pt reports that she did not take her Toprol last night b/c she fell asleep. Pt lives in [place] and will need to find a ride to [place] if she needs to be seen. Pt aware I will see if I can discuss case with Dr. Caryl Comes for advisement, per her request. Aware I will call her back once I have spoken with Dr. Caryl Comes

## 2019-04-25 NOTE — Telephone Encounter (Signed)
Pt calling in reporting elevated heart rates since yesterday.  She "gets winded "with a little walking, it gets worse". Numbers since 6:15 AM 120/73, 106 110/77  121 119/69, 99 Pt has hx of inappropriate ICD discharges from elevated HRs and she is concerned that she will get shocked again. Pt also has a hx of interval AFib. Pt on Coumadin & Sotalol Pt reports sending transmission in this morning. Made aware that I would discuss with device clinic and call her back.

## 2019-04-25 NOTE — Telephone Encounter (Signed)
Manual transmission from 04/25/19 reviewed. Available NSVT EGMs from 04/24/19 appear AF w/RVR (very brief) in VT-1 monitor zone, programmed at 140bpm. First therapy zone is programmed at 200bpm. Pt is on warfarin and sotalol. No AT/AF burden available as atrial lead is off due to sensing issues.  See phone note opened 04/25/19.   Presenting rhythm as of 04/25/19 at 05:35:

## 2019-04-25 NOTE — Telephone Encounter (Signed)
To: CV DIV [facility] ST TRIAGE    From: [place]: 04/24/2019 7:04 PM     *-*-*This message has not been handled.*-*-*  Having a little trouble really short winded all day. When I got home from work just walking int house got really hard to breathe took vitals. Heart rate was 103.  Then got bp machine. 121/73/98. Then again in about 5 minutes 109/70/92. Now is 112/61/80

## 2019-04-25 NOTE — Telephone Encounter (Signed)
See MyChart message from 04/25/19.

## 2019-04-25 NOTE — Telephone Encounter (Addendum)
Informed pt that Dr. Caryl Comes recommended that she take her Sotalol & Toprol now and get her in w/ AFib clinic. Pt agreeable to plan. Pt scheduled for tomorrow afternoon. Information/directions to [facility] sent to pt via MyChart. Advised pt that is she returns to NSR by tomorrow, prior to appt, to call and cancel appt.  Also advised that if she does return to NSR and cancels appt to call back to our office if she continues to experience more frequent AFib issues. Patient verbalized understanding and agreeable to plan.   Will forward note to [facility] staff for their [place] clinic documented ICD transmission findings in MyChart message today.

## 2019-04-26 ENCOUNTER — Ambulatory Visit (HOSPITAL_COMMUNITY)
Admission: RE | Admit: 2019-04-26 | Discharge: 2019-04-26 | Disposition: A | Discharge status: Home / Self Care | Payer: Medicare Other | Source: Ambulatory Visit | Attending: Physician Assistant | Admitting: Physician Assistant

## 2019-04-26 ENCOUNTER — Other Ambulatory Visit: Payer: Self-pay

## 2019-04-26 ENCOUNTER — Encounter (HOSPITAL_COMMUNITY): Payer: Self-pay | Admitting: Physician Assistant

## 2019-04-26 VITALS — BP 116/86 | HR 97 | Ht 62.0 in | Wt 152.6 lb

## 2019-04-26 DIAGNOSIS — I421 Obstructive hypertrophic cardiomyopathy: Secondary | ICD-10-CM | POA: Insufficient documentation

## 2019-04-26 DIAGNOSIS — Z7901 Long term (current) use of anticoagulants: Secondary | ICD-10-CM | POA: Diagnosis not present

## 2019-04-26 DIAGNOSIS — Z79899 Other long term (current) drug therapy: Secondary | ICD-10-CM | POA: Diagnosis not present

## 2019-04-26 DIAGNOSIS — G4733 Obstructive sleep apnea (adult) (pediatric): Secondary | ICD-10-CM | POA: Insufficient documentation

## 2019-04-26 DIAGNOSIS — I480 Paroxysmal atrial fibrillation: Secondary | ICD-10-CM | POA: Diagnosis present

## 2019-04-26 DIAGNOSIS — D6869 Other thrombophilia: Secondary | ICD-10-CM | POA: Diagnosis not present

## 2019-04-26 NOTE — Progress Notes (Signed)
Primary Care Physician: Nelda Bucks, MD Primary Electrophysiologist: Dr Graciela Husbands Referring Physician: Dr Gillermina [facility]   Aimee Edwards is a 73 y.o. female with a history of hypertrophic cardiomyopathy status post septal myectomy, ICD implantation, OSA. and paroxysmal atrial fibrillation who presents for follow up in the [facility]. Patient reports that on the night of 04/24/19 she fell asleep and missed a dose of her metoprolol. She began having elevated heart rates between 100-120s associated with SOB and fatigue.  She sent in a remote device transmission which did show afib with brief episodes of RVR. She remains in afib today. She does have a diagnosis of OSA and has not been on CPAP for 5 years. There were no other specific triggers that the patient could identify. She is on coumadin for a CHADS2VASC score of 3.  Today, she denies symptoms of chest pain, orthopnea, PND, lower extremity edema, dizziness, presyncope, syncope, snoring, daytime somnolence, bleeding, or neurologic sequela. The patient is tolerating medications without difficulties and is otherwise without complaint today.    Atrial Fibrillation Risk Factors:  she does have symptoms or diagnosis of sleep apnea. she is not compliant with CPAP therapy. she does not have a history of rheumatic fever. she does not have a history of alcohol use. The patient does have a history of early familial atrial fibrillation or other arrhythmias.  she has a BMI of Body mass index is 27.91 kg/m.Marland Kitchen Filed Weights   04/26/19 1515  Weight: 69.2 kg    Family History  Problem Relation Age of Onset  . Alzheimer's disease Mother   . Emphysema Father   . Diabetes Father   . Heart disease Brother   . Cancer Brother   . Diabetes Paternal Grandmother   . Cardiomyopathy Other        several in family have had this      Atrial Fibrillation Management history:  Previous antiarrhythmic drugs: sotalol  Previous cardioversions: none Previous ablations: none CHADS2VASC score: 3 Anticoagulation history: warfarin   Past Medical History:  Diagnosis Date  . Anxiety   . Atrial fibrillation (HCC)   . Back pain   . Cardiomyopathy, hypertrophic obstructive (HCC)   . Complication of anesthesia   . Degeneration of cervical intervertebral disc   . Dyslipidemia   . Edema   . Gastroesophageal reflux disease   . Hernia, abdominal   . Hypokalemia   . ICD (implantable cardioverter-defibrillator) in place   . Mitral regurgitation   . Pinched nerve   . PONV (postoperative nausea and vomiting)   . Presence of permanent cardiac pacemaker   . Reflux   . Right shoulder pain   . Shortness of breath on exertion   . Sleep apnea    Past Surgical History:  Procedure Laterality Date  . CARDIAC CATHETERIZATION    . COLONSCOPY    . CORONARY ARTERY BYPASS GRAFT     pt denies this. She said she never had bypass surgery.  . ICD GENERATOR CHANGE    . ICD/BIV ICD FUNCTION(DFT) TEST N/A 06/26/2018   Procedure: ICD/BIV ICD FUNCTION (DFT) TEST;  Surgeon: Marinus Maw, MD;  Location: [facility];  Service: Cardiovascular;  Laterality: N/A;  . MYOMECTOMY    . OTHER HEART/PERICARD OPS    . PACEMAKER INSERTION    . TONSILLECTOMY    . VAGINAL HYSTERECTOMY      Current Outpatient Medications  Medication Sig Dispense Refill  . atorvastatin (LIPITOR) 40 MG tablet  TAKE ONE TABLET BY MOUTH DAILY AT 6 PM 90 tablet 2  . furosemide (LASIX) 40 MG tablet Take 20-40 mg by mouth daily.    . magnesium oxide (MAG-OX) 400 MG tablet Take 1 tablet (400 mg total) by mouth daily. 90 tablet 3  . meclizine (ANTIVERT) 25 MG tablet Take 1 tablet by mouth daily as needed for dizziness.    . metoprolol succinate (TOPROL-XL) 25 MG 24 hr tablet TAKE ONE TABLET BY MOUTH EVERY NIGHT AT BEDTIME 90 tablet 3  . sotalol (BETAPACE) 80 MG tablet Take 0.5 tablets (40 mg total) by mouth every 12 (twelve) hours. 180 tablet 3  .  spironolactone (ALDACTONE) 25 MG tablet Take 0.5 tablets (12.5 mg total) by mouth daily. 90 tablet 3  . warfarin (COUMADIN) 5 MG tablet Take 1 tablet (5 mg) by mouth daily as directed by the coumadin clinic (Patient taking differently: Take 5 mg by mouth at bedtime. ) 30 tablet 2   No current facility-administered medications for this encounter.     Allergies  Allergen Reactions  . Codeine Nausea And Vomiting  . Lortab [Hydrocodone-Acetaminophen] Nausea And Vomiting    Social History   Socioeconomic History  . Marital status: Divorced    Spouse name: Not on file  . Number of children: 3  . Years of education: Not on file  . Highest education level: Some college, no degree  Occupational History  . Not on file  Social Needs  . Financial resource strain: Not on file  . Food insecurity    Worry: Not on file    Inability: Not on file  . Transportation needs    Medical: Not on file    Non-medical: Not on file  Tobacco Use  . Smoking status: Never Smoker  . Smokeless tobacco: Never Used  Substance and Sexual Activity  . Alcohol use: Not Currently    Comment: "once in awhile"  . Drug use: Never  . Sexual activity: Not Currently  Lifestyle  . Physical activity    Days per week: Not on file    Minutes per session: Not on file  . Stress: Not on file  Relationships  . Social Musicianconnections    Talks on phone: Not on file    Gets together: Not on file    Attends religious service: Not on file    Active member of club or organization: Not on file    Attends meetings of clubs or organizations: Not on file    Relationship status: Not on file  . Intimate partner violence    Fear of current or ex partner: Not on file    Emotionally abused: Not on file    Physically abused: Not on file    Forced sexual activity: Not on file  Other Topics Concern  . Not on file  Social History Narrative   Lives at home alone    Right handed   Caffeine: quit Jan 2020     ROS- All systems are  reviewed and negative except as per the HPI above.  Physical Exam: Vitals:   04/26/19 1515  BP: 116/86  Pulse: 97  SpO2: 95%  Weight: 69.2 kg  Height: 5\' 2"  (1.575 m)    GEN- The patient is well appearing, alert and oriented x 3 today.   Head- normocephalic, atraumatic Eyes-  Sclera clear, conjunctiva pink Ears- hearing intact Oropharynx- clear Neck- supple  Lungs- Clear to ausculation bilaterally, normal work of breathing Heart- irregular rate and rhythm, no murmurs,  rubs or gallops  GI- soft, NT, ND, + BS Extremities- no clubbing, cyanosis, or edema MS- no significant deformity or atrophy Skin- no rash or lesion Psych- euthymic mood, full affect Neuro- strength and sensation are intact  Wt Readings from Last 3 Encounters:  04/26/19 69.2 kg  01/09/19 69.2 kg  10/13/18 69.4 kg    EKG today demonstrates afib HR 97, LVH, QRS 142, QTc 462  Echo 06/23/18 demonstrated  1. The left ventricle has LVEF is approximately 45% with hypokinesis/akinesis of the inferior, inferoseptal walls. The cavity size is mild to moderately increased. There is no left ventricular wall thickness. Echo evidence of impaired relaxation  diastolic filling patterns.  2. Moderately dilated left atrial size.  3. Normal right atrial size.  4. The mitral valve normal in structure. Regurgitation is mild by color flow Doppler.  5. Normal tricuspid valve.  6. Tricuspid regurgitation is mild.  7. The aortic valve tricuspid. There is mild thickening and mild calcification of the aortic valve. Aortic valve regurgitation is mild by color flow Doppler.  8. No atrial level shunt detected by color flow Doppler.  Epic records are reviewed at length today  Assessment and Plan:  1. Paroxysmal atrial fibrillation Patient in afib with symptoms of SOB and fatigue.  After discussing the risks and benefits, will arrange for TEE/DCCV as she is on coumadin.  No room in her BP to increase rate control.  Continue sotalol  40 mg BID.  Will check Bmet/CBC/magnesium today. Lifestyle changes as below.  This patients CHA2DS2-VASc Score and unadjusted Ischemic Stroke Rate (% per year) is equal to 3.2 % stroke rate/year from a score of 3  Above score calculated as 1 point each if present [CHF, HTN, DM, Vascular=MI/PAD/Aortic Plaque, Age if 65-74, or Female] Above score calculated as 2 points each if present [Age > 75, or Stroke/TIA/TE]   2. Obstructive sleep apnea The importance of adequate treatment of sleep apnea was discussed today in order to improve our ability to maintain sinus rhythm long term. Patient agreeable to discussing resuming CPAP therapy with her PCP.  3. Hypertrophic obstructive cardiomyopathy S/p myectomy 1996. Followed by Dr Caryl Comes.   Follow up in the AF clinic one week post DCCV.   [facility] [address] [place], [place] [address] [phone] 04/26/2019 4:01 PM

## 2019-04-26 NOTE — Patient Instructions (Addendum)
Cardioversion scheduled for   - Arrive at the [facility] and go to admitting at  -Do not eat or drink anything after midnight the night prior to your procedure.  - Take all your morning medications with a sip of water prior to arrival.  - You will not be able to drive home after your procedure.

## 2019-04-26 NOTE — H&P (View-Only) (Signed)
Primary Care Physician: Nelda Bucks, MD Primary Electrophysiologist: Dr Graciela Husbands Referring Physician: Dr Gillermina [facility]   Aimee Edwards is a 73 y.o. female with a history of hypertrophic cardiomyopathy status post septal myectomy, ICD implantation, OSA. and paroxysmal atrial fibrillation who presents for follow up in the [facility]. Patient reports that on the night of 04/24/19 she fell asleep and missed a dose of her metoprolol. She began having elevated heart rates between 100-120s associated with SOB and fatigue.  She sent in a remote device transmission which did show afib with brief episodes of RVR. She remains in afib today. She does have a diagnosis of OSA and has not been on CPAP for 5 years. There were no other specific triggers that the patient could identify. She is on coumadin for a CHADS2VASC score of 3.  Today, she denies symptoms of chest pain, orthopnea, PND, lower extremity edema, dizziness, presyncope, syncope, snoring, daytime somnolence, bleeding, or neurologic sequela. The patient is tolerating medications without difficulties and is otherwise without complaint today.    Atrial Fibrillation Risk Factors:  she does have symptoms or diagnosis of sleep apnea. she is not compliant with CPAP therapy. she does not have a history of rheumatic fever. she does not have a history of alcohol use. The patient does have a history of early familial atrial fibrillation or other arrhythmias.  she has a BMI of Body mass index is 27.91 kg/m.Marland Kitchen Filed Weights   04/26/19 1515  Weight: 69.2 kg    Family History  Problem Relation Age of Onset  . Alzheimer's disease Mother   . Emphysema Father   . Diabetes Father   . Heart disease Brother   . Cancer Brother   . Diabetes Paternal Grandmother   . Cardiomyopathy Other        several in family have had this      Atrial Fibrillation Management history:  Previous antiarrhythmic drugs: sotalol  Previous cardioversions: none Previous ablations: none CHADS2VASC score: 3 Anticoagulation history: warfarin   Past Medical History:  Diagnosis Date  . Anxiety   . Atrial fibrillation (HCC)   . Back pain   . Cardiomyopathy, hypertrophic obstructive (HCC)   . Complication of anesthesia   . Degeneration of cervical intervertebral disc   . Dyslipidemia   . Edema   . Gastroesophageal reflux disease   . Hernia, abdominal   . Hypokalemia   . ICD (implantable cardioverter-defibrillator) in place   . Mitral regurgitation   . Pinched nerve   . PONV (postoperative nausea and vomiting)   . Presence of permanent cardiac pacemaker   . Reflux   . Right shoulder pain   . Shortness of breath on exertion   . Sleep apnea    Past Surgical History:  Procedure Laterality Date  . CARDIAC CATHETERIZATION    . COLONSCOPY    . CORONARY ARTERY BYPASS GRAFT     pt denies this. She said she never had bypass surgery.  . ICD GENERATOR CHANGE    . ICD/BIV ICD FUNCTION(DFT) TEST N/A 06/26/2018   Procedure: ICD/BIV ICD FUNCTION (DFT) TEST;  Surgeon: Marinus Maw, MD;  Location: [facility];  Service: Cardiovascular;  Laterality: N/A;  . MYOMECTOMY    . OTHER HEART/PERICARD OPS    . PACEMAKER INSERTION    . TONSILLECTOMY    . VAGINAL HYSTERECTOMY      Current Outpatient Medications  Medication Sig Dispense Refill  . atorvastatin (LIPITOR) 40 MG tablet  TAKE ONE TABLET BY MOUTH DAILY AT 6 PM 90 tablet 2  . furosemide (LASIX) 40 MG tablet Take 20-40 mg by mouth daily.    . magnesium oxide (MAG-OX) 400 MG tablet Take 1 tablet (400 mg total) by mouth daily. 90 tablet 3  . meclizine (ANTIVERT) 25 MG tablet Take 1 tablet by mouth daily as needed for dizziness.    . metoprolol succinate (TOPROL-XL) 25 MG 24 hr tablet TAKE ONE TABLET BY MOUTH EVERY NIGHT AT BEDTIME 90 tablet 3  . sotalol (BETAPACE) 80 MG tablet Take 0.5 tablets (40 mg total) by mouth every 12 (twelve) hours. 180 tablet 3  .  spironolactone (ALDACTONE) 25 MG tablet Take 0.5 tablets (12.5 mg total) by mouth daily. 90 tablet 3  . warfarin (COUMADIN) 5 MG tablet Take 1 tablet (5 mg) by mouth daily as directed by the coumadin clinic (Patient taking differently: Take 5 mg by mouth at bedtime. ) 30 tablet 2   No current facility-administered medications for this encounter.     Allergies  Allergen Reactions  . Codeine Nausea And Vomiting  . Lortab [Hydrocodone-Acetaminophen] Nausea And Vomiting    Social History   Socioeconomic History  . Marital status: Divorced    Spouse name: Not on file  . Number of children: 3  . Years of education: Not on file  . Highest education level: Some college, no degree  Occupational History  . Not on file  Social Needs  . Financial resource strain: Not on file  . Food insecurity    Worry: Not on file    Inability: Not on file  . Transportation needs    Medical: Not on file    Non-medical: Not on file  Tobacco Use  . Smoking status: Never Smoker  . Smokeless tobacco: Never Used  Substance and Sexual Activity  . Alcohol use: Not Currently    Comment: "once in awhile"  . Drug use: Never  . Sexual activity: Not Currently  Lifestyle  . Physical activity    Days per week: Not on file    Minutes per session: Not on file  . Stress: Not on file  Relationships  . Social connections    Talks on phone: Not on file    Gets together: Not on file    Attends religious service: Not on file    Active member of club or organization: Not on file    Attends meetings of clubs or organizations: Not on file    Relationship status: Not on file  . Intimate partner violence    Fear of current or ex partner: Not on file    Emotionally abused: Not on file    Physically abused: Not on file    Forced sexual activity: Not on file  Other Topics Concern  . Not on file  Social History Narrative   Lives at home alone    Right handed   Caffeine: quit Jan 2020     ROS- All systems are  reviewed and negative except as per the HPI above.  Physical Exam: Vitals:   04/26/19 1515  BP: 116/86  Pulse: 97  SpO2: 95%  Weight: 69.2 kg  Height: 5' 2" (1.575 m)    GEN- The patient is well appearing, alert and oriented x 3 today.   Head- normocephalic, atraumatic Eyes-  Sclera clear, conjunctiva pink Ears- hearing intact Oropharynx- clear Neck- supple  Lungs- Clear to ausculation bilaterally, normal work of breathing Heart- irregular rate and rhythm, no murmurs,   rubs or gallops  GI- soft, NT, ND, + BS Extremities- no clubbing, cyanosis, or edema MS- no significant deformity or atrophy Skin- no rash or lesion Psych- euthymic mood, full affect Neuro- strength and sensation are intact  Wt Readings from Last 3 Encounters:  04/26/19 69.2 kg  01/09/19 69.2 kg  10/13/18 69.4 kg    EKG today demonstrates afib HR 97, LVH, QRS 142, QTc 462  Echo 06/23/18 demonstrated  1. The left ventricle has LVEF is approximately 45% with hypokinesis/akinesis of the inferior, inferoseptal walls. The cavity size is mild to moderately increased. There is no left ventricular wall thickness. Echo evidence of impaired relaxation  diastolic filling patterns.  2. Moderately dilated left atrial size.  3. Normal right atrial size.  4. The mitral valve normal in structure. Regurgitation is mild by color flow Doppler.  5. Normal tricuspid valve.  6. Tricuspid regurgitation is mild.  7. The aortic valve tricuspid. There is mild thickening and mild calcification of the aortic valve. Aortic valve regurgitation is mild by color flow Doppler.  8. No atrial level shunt detected by color flow Doppler.  Epic records are reviewed at length today  Assessment and Plan:  1. Paroxysmal atrial fibrillation Patient in afib with symptoms of SOB and fatigue.  After discussing the risks and benefits, will arrange for TEE/DCCV as she is on coumadin.  No room in her BP to increase rate control.  Continue sotalol  40 mg BID.  Will check Bmet/CBC/magnesium today. Lifestyle changes as below.  This patients CHA2DS2-VASc Score and unadjusted Ischemic Stroke Rate (% per year) is equal to 3.2 % stroke rate/year from a score of 3  Above score calculated as 1 point each if present [CHF, HTN, DM, Vascular=MI/PAD/Aortic Plaque, Age if 65-74, or Female] Above score calculated as 2 points each if present [Age > 75, or Stroke/TIA/TE]   2. Obstructive sleep apnea The importance of adequate treatment of sleep apnea was discussed today in order to improve our ability to maintain sinus rhythm long term. Patient agreeable to discussing resuming CPAP therapy with her PCP.  3. Hypertrophic obstructive cardiomyopathy S/p myectomy 1996. Followed by Dr Caryl Comes.   Follow up in the AF clinic one week post DCCV.   [facility] [address] [place], [place] [address] [phone] 04/26/2019 4:01 PM

## 2019-04-27 ENCOUNTER — Other Ambulatory Visit (HOSPITAL_COMMUNITY)
Admission: RE | Admit: 2019-04-27 | Discharge: 2019-04-27 | Disposition: A | Discharge status: Home / Self Care | Payer: Medicare Other | Source: Ambulatory Visit | Attending: Cardiovascular Disease | Admitting: Cardiovascular Disease

## 2019-04-27 ENCOUNTER — Other Ambulatory Visit (HOSPITAL_COMMUNITY): Payer: Self-pay | Admitting: *Deleted

## 2019-04-27 ENCOUNTER — Telehealth (HOSPITAL_COMMUNITY): Payer: Self-pay | Admitting: *Deleted

## 2019-04-27 ENCOUNTER — Other Ambulatory Visit: Payer: Self-pay

## 2019-04-27 DIAGNOSIS — I4819 Other persistent atrial fibrillation: Secondary | ICD-10-CM

## 2019-04-27 NOTE — Telephone Encounter (Signed)
Patient scheduled for TEE/DCCV 12/9. Pt has INR machine at home will check INR prior to arrival and bring result with her. Pt verbalized understanding of instructions.

## 2019-04-28 ENCOUNTER — Other Ambulatory Visit (HOSPITAL_COMMUNITY)
Admission: RE | Admit: 2019-04-28 | Discharge: 2019-04-28 | Disposition: A | Discharge status: Home / Self Care | Payer: Medicare Other | Source: Ambulatory Visit | Attending: Cardiovascular Disease | Admitting: Cardiovascular Disease

## 2019-04-28 DIAGNOSIS — Z01812 Encounter for preprocedural laboratory examination: Secondary | ICD-10-CM | POA: Diagnosis present

## 2019-04-28 DIAGNOSIS — Z20828 Contact with and (suspected) exposure to other viral communicable diseases: Secondary | ICD-10-CM | POA: Diagnosis not present

## 2019-04-30 ENCOUNTER — Telehealth: Payer: Self-pay | Admitting: Internal Medicine

## 2019-04-30 LAB — NOVEL CORONAVIRUS, NAA (HOSP ORDER, SEND-OUT TO REF LAB; TAT 18-24 HRS): SARS-CoV-2, NAA: NOT DETECTED

## 2019-04-30 NOTE — Telephone Encounter (Signed)
New message       STAT if HR is under 50 or over 120 (normal HR is 60-100 beats per minute)  1) What is your heart rate? 90  2) Do you have a log of your heart rate readings (document readings)?100  3) Do you have any other symptoms? Patient calling to report she is still in afib, back pain , stomach pain.

## 2019-04-30 NOTE — Telephone Encounter (Signed)
Discussed with patient - she has been sleeping in a recliner since end of last week - denies abdominal bloating/fullness or weight gain. Discussed with Adline Peals PA no correlation symptoms and current afib episodes. Pt to try tylenol see if this helps with her symptoms. Pt scheduled for cardioversion on 12/9.

## 2019-05-01 ENCOUNTER — Ambulatory Visit (INDEPENDENT_AMBULATORY_CARE_PROVIDER_SITE_OTHER): Payer: Medicare Other | Admitting: *Deleted

## 2019-05-01 DIAGNOSIS — I422 Other hypertrophic cardiomyopathy: Secondary | ICD-10-CM | POA: Diagnosis not present

## 2019-05-01 LAB — CUP PACEART REMOTE DEVICE CHECK
Battery Remaining Longevity: 24 mo
Battery Remaining Percentage: 31 %
Brady Statistic RA Percent Paced: 0 %
Brady Statistic RV Percent Paced: 0 %
Date Time Interrogation Session: 20201208084300
HighPow Impedance: 62 Ohm
Implantable Lead Implant Date: 19970916
Implantable Lead Implant Date: 19970916
Implantable Lead Location: 753859
Implantable Lead Location: 753860
Implantable Lead Model: 125
Implantable Lead Model: 4269
Implantable Lead Serial Number: 222062
Implantable Lead Serial Number: 279972
Implantable Pulse Generator Implant Date: 20101020
Lead Channel Impedance Value: 470 Ohm
Lead Channel Pacing Threshold Amplitude: 1.2 V
Lead Channel Pacing Threshold Amplitude: 4 V
Lead Channel Pacing Threshold Pulse Width: 0.4 ms
Lead Channel Pacing Threshold Pulse Width: 1 ms
Lead Channel Setting Pacing Amplitude: 2.6 V
Lead Channel Setting Pacing Pulse Width: 0.4 ms
Lead Channel Setting Sensing Sensitivity: 0.6 mV
Pulse Gen Serial Number: 149386

## 2019-05-01 NOTE — Anesthesia Preprocedure Evaluation (Addendum)
Anesthesia Evaluation  Patient identified by MRN, date of birth, ID band Patient awake    Reviewed: Allergy & Precautions, NPO status , Patient's Chart, lab work & pertinent test results  History of Anesthesia Complications (+) PONV and history of anesthetic complications  Airway Mallampati: II  TM Distance: >3 FB Neck ROM: Full    Dental  (+) Teeth Intact, Dental Advisory Given   Pulmonary sleep apnea ,  Does not use CPAP, last used 8-10 years ago   Pulmonary exam normal breath sounds clear to auscultation       Cardiovascular Exercise Tolerance: Poor Normal cardiovascular exam+ dysrhythmias Atrial Fibrillation + pacemaker + Cardiac Defibrillator + Valvular Problems/Murmurs MR and AI  Rhythm:Regular Rate:Normal  Last echo 05/2018:   1. The left ventricle has LVEF is approximately 45% with hypokinesis/akinesis of the inferior, inferoseptal walls. The cavity size is mild to moderately increased. There is no left ventricular wall thickness. Echo evidence of impaired relaxation diastolic filling patterns.  2. Moderately dilated left atrial size.  3. Normal right atrial size.  4. The mitral valve normal in structure. Regurgitation is mild by color flow Doppler.  5. Normal tricuspid valve.  6. Tricuspid regurgitation is mild.  7. The aortic valve tricuspid. There is mild thickening and mild calcification of the aortic valve. Aortic valve regurgitation is mild by color flow Doppler.  8. No atrial level shunt detected by color flow Doppler.    Currently short of breath with this episode of Afib, <42mets even when in NSR per pt   Neuro/Psych PSYCHIATRIC DISORDERS Anxiety negative neurological ROS     GI/Hepatic GERD  Medicated and Controlled,  Endo/Other    Renal/GU   negative genitourinary   Musculoskeletal  (+) Arthritis , Osteoarthritis,  C spine disc disease   Abdominal Normal abdominal exam  (+)   Peds   Hematology   Anesthesia Other Findings HLD  Reproductive/Obstetrics negative OB ROS                            Anesthesia Physical Anesthesia Plan  ASA: III  Anesthesia Plan: General   Post-op Pain Management:    Induction: Intravenous  PONV Risk Score and Plan: Propofol infusion, TIVA and Treatment may vary due to age or medical condition  Airway Management Planned: Natural Airway and Simple Face Mask  Additional Equipment: None  Intra-op Plan:   Post-operative Plan: Extubation in OR  Informed Consent: I have reviewed the patients History and Physical, chart, labs and discussed the procedure including the risks, benefits and alternatives for the proposed anesthesia with the patient or authorized representative who has indicated his/her understanding and acceptance.     Dental advisory given  Plan Discussed with: CRNA  Anesthesia Plan Comments:         Anesthesia Quick Evaluation

## 2019-05-02 ENCOUNTER — Ambulatory Visit (HOSPITAL_BASED_OUTPATIENT_CLINIC_OR_DEPARTMENT_OTHER)
Admission: RE | Admit: 2019-05-02 | Discharge: 2019-05-02 | Disposition: A | Discharge status: Home / Self Care | Payer: Medicare Other | Source: Ambulatory Visit | Attending: Physician Assistant | Admitting: Physician Assistant

## 2019-05-02 ENCOUNTER — Other Ambulatory Visit: Payer: Self-pay

## 2019-05-02 ENCOUNTER — Ambulatory Visit (HOSPITAL_COMMUNITY)
Admission: RE | Admit: 2019-05-02 | Discharge: 2019-05-02 | Disposition: A | Discharge status: Home / Self Care | Payer: Medicare Other | Source: Ambulatory Visit | Attending: Cardiovascular Disease | Admitting: Cardiovascular Disease

## 2019-05-02 ENCOUNTER — Telehealth: Payer: Self-pay | Admitting: Nurse Practitioner

## 2019-05-02 ENCOUNTER — Encounter (HOSPITAL_COMMUNITY)
Admission: RE | Disposition: A | Discharge status: Home / Self Care | Payer: Medicare Other | Source: Ambulatory Visit | Attending: Cardiovascular Disease

## 2019-05-02 ENCOUNTER — Encounter (HOSPITAL_COMMUNITY): Payer: Self-pay | Admitting: *Deleted

## 2019-05-02 ENCOUNTER — Ambulatory Visit (HOSPITAL_COMMUNITY): Payer: Medicare Other | Admitting: Anesthesiology

## 2019-05-02 DIAGNOSIS — Z951 Presence of aortocoronary bypass graft: Secondary | ICD-10-CM | POA: Insufficient documentation

## 2019-05-02 DIAGNOSIS — I351 Nonrheumatic aortic (valve) insufficiency: Secondary | ICD-10-CM

## 2019-05-02 DIAGNOSIS — I420 Dilated cardiomyopathy: Secondary | ICD-10-CM | POA: Diagnosis not present

## 2019-05-02 DIAGNOSIS — I491 Atrial premature depolarization: Secondary | ICD-10-CM | POA: Insufficient documentation

## 2019-05-02 DIAGNOSIS — Z7901 Long term (current) use of anticoagulants: Secondary | ICD-10-CM | POA: Diagnosis not present

## 2019-05-02 DIAGNOSIS — Z95810 Presence of automatic (implantable) cardiac defibrillator: Secondary | ICD-10-CM | POA: Diagnosis not present

## 2019-05-02 DIAGNOSIS — I4819 Other persistent atrial fibrillation: Secondary | ICD-10-CM | POA: Diagnosis not present

## 2019-05-02 DIAGNOSIS — I480 Paroxysmal atrial fibrillation: Secondary | ICD-10-CM | POA: Diagnosis present

## 2019-05-02 DIAGNOSIS — Z885 Allergy status to narcotic agent status: Secondary | ICD-10-CM | POA: Diagnosis not present

## 2019-05-02 DIAGNOSIS — I421 Obstructive hypertrophic cardiomyopathy: Secondary | ICD-10-CM | POA: Diagnosis not present

## 2019-05-02 DIAGNOSIS — G4733 Obstructive sleep apnea (adult) (pediatric): Secondary | ICD-10-CM | POA: Diagnosis not present

## 2019-05-02 DIAGNOSIS — E785 Hyperlipidemia, unspecified: Secondary | ICD-10-CM | POA: Insufficient documentation

## 2019-05-02 DIAGNOSIS — Z79899 Other long term (current) drug therapy: Secondary | ICD-10-CM | POA: Diagnosis not present

## 2019-05-02 DIAGNOSIS — I10 Essential (primary) hypertension: Secondary | ICD-10-CM | POA: Insufficient documentation

## 2019-05-02 DIAGNOSIS — I472 Ventricular tachycardia, unspecified: Secondary | ICD-10-CM

## 2019-05-02 DIAGNOSIS — I083 Combined rheumatic disorders of mitral, aortic and tricuspid valves: Secondary | ICD-10-CM | POA: Diagnosis not present

## 2019-05-02 DIAGNOSIS — K219 Gastro-esophageal reflux disease without esophagitis: Secondary | ICD-10-CM | POA: Insufficient documentation

## 2019-05-02 DIAGNOSIS — Z8249 Family history of ischemic heart disease and other diseases of the circulatory system: Secondary | ICD-10-CM | POA: Insufficient documentation

## 2019-05-02 DIAGNOSIS — I340 Nonrheumatic mitral (valve) insufficiency: Secondary | ICD-10-CM | POA: Diagnosis not present

## 2019-05-02 HISTORY — PX: TEE WITHOUT CARDIOVERSION: SHX5443

## 2019-05-02 HISTORY — PX: CARDIOVERSION: SHX1299

## 2019-05-02 LAB — POCT I-STAT, CHEM 8
BUN: 21 mg/dL (ref 8–23)
Calcium, Ion: 1.19 mmol/L (ref 1.15–1.40)
Chloride: 103 mmol/L (ref 98–111)
Creatinine, Ser: 0.7 mg/dL (ref 0.44–1.00)
Glucose, Bld: 113 mg/dL — ABNORMAL HIGH (ref 70–99)
HCT: 43 % (ref 36.0–46.0)
Hemoglobin: 14.6 g/dL (ref 12.0–15.0)
Potassium: 3.6 mmol/L (ref 3.5–5.1)
Sodium: 142 mmol/L (ref 135–145)
TCO2: 26 mmol/L (ref 22–32)

## 2019-05-02 SURGERY — ECHOCARDIOGRAM, TRANSESOPHAGEAL
Anesthesia: General

## 2019-05-02 MED ORDER — PROPOFOL 10 MG/ML IV BOLUS
INTRAVENOUS | Status: DC | PRN
  Administered 2019-05-02: 10 mg via INTRAVENOUS

## 2019-05-02 MED ORDER — PROPOFOL 500 MG/50ML IV EMUL
INTRAVENOUS | Status: DC | PRN
  Administered 2019-05-02: 75 ug/kg/min via INTRAVENOUS

## 2019-05-02 MED ORDER — SODIUM CHLORIDE 0.9 % IV SOLN
INTRAVENOUS | Status: DC
  Administered 2019-05-02 (×3): via INTRAVENOUS

## 2019-05-02 MED ORDER — DEXMEDETOMIDINE HCL 200 MCG/2ML IV SOLN
INTRAVENOUS | Status: DC | PRN
  Administered 2019-05-02: 20 ug via INTRAVENOUS

## 2019-05-02 MED ORDER — SOTALOL HCL 80 MG PO TABS: 80.0000 mg | ORAL_TABLET | Freq: Two times a day (BID) | ORAL | 3 refills | Status: DC

## 2019-05-02 MED ORDER — LIDOCAINE 2% (20 MG/ML) 5 ML SYRINGE
INTRAMUSCULAR | Status: DC | PRN
  Administered 2019-05-02: 40 mg via INTRAVENOUS

## 2019-05-02 NOTE — Telephone Encounter (Signed)
   Pt called this evening to report that despite having TEE/DCCV this AM, she hasn't noticed any change in her baseline level of DOE.  She thinks that maybe her dyspnea worsened some tonight.  She does have an Apple watch and her HRs have been in the 60's to 70's and per her watch, in regular sinus rhythm.  I advised that if she is feeling worse tonight, than she may need to present to her local ED.  If however, her dyspnea is stable (she is not in any acute distress on the phone), she can either wait until her appt next Tues or call us back in the AM to see if we can work her in sooner.  Caller verbalized understanding and was grateful for the call back.  Murray Hodgkins, NP 05/02/2019, 6:52 PM

## 2019-05-02 NOTE — Anesthesia Postprocedure Evaluation (Signed)
Anesthesia Post Note  Patient: Aimee Edwards  Procedure(s) Performed: TRANSESOPHAGEAL ECHOCARDIOGRAM (TEE) (N/A ) CARDIOVERSION (N/A )     Patient location during evaluation: PACU Anesthesia Type: General Level of consciousness: awake and alert Pain management: pain level controlled Vital Signs Assessment: post-procedure vital signs reviewed and stable Respiratory status: spontaneous breathing, nonlabored ventilation, respiratory function stable and patient connected to nasal cannula oxygen Cardiovascular status: blood pressure returned to baseline and stable Postop Assessment: no apparent nausea or vomiting Anesthetic complications: no    Last Vitals:  Vitals:   05/02/19 0945 05/02/19 0950  BP: (!) 81/43 (!) 88/52  Pulse: (!) 55 (!) 56  Resp: 16 14  Temp:    SpO2: 99% 100%    Last Pain:  Vitals:   05/02/19 0950  TempSrc:   PainSc: 0-No pain                 Pervis Hocking

## 2019-05-02 NOTE — Interval H&P Note (Signed)
History and Physical Interval Note:  05/02/2019 8:20 AM  Aimee Edwards  has presented today for surgery, with the diagnosis of AFIB.  The various methods of treatment have been discussed with the patient and family. After consideration of risks, benefits and other options for treatment, the patient has consented to  Procedure(s): TRANSESOPHAGEAL ECHOCARDIOGRAM (TEE) (N/A) CARDIOVERSION (N/A) as a surgical intervention.  The patient's history has been reviewed, patient examined, no change in status, stable for surgery.  I have reviewed the patient's chart and labs.  Questions were answered to the patient's satisfaction.     Dartanion Teo

## 2019-05-02 NOTE — Op Note (Signed)
Procedure: Electrical Cardioversion Indications:  Atrial Fibrillation  Procedure Details:  Consent: Risks of procedure as well as the alternatives and risks of each were explained to the (patient/caregiver).  Consent for procedure obtained.  Time Out: Verified patient identification, verified procedure, site/side was marked, verified correct patient position, special equipment/implants available, medications/allergies/relevent history reviewed, required imaging and test results available.  Performed  Patient placed on cardiac monitor, pulse oximetry, supplemental oxygen as necessary.  Sedation given: IV propofol. Dr. Doroteo Glassman Pacer pads placed anterior and posterior chest.  Cardioverted 1 time(s).  Cardioversion with synchronized biphasic 120J shock.  Evaluation: Findings: Post procedure EKG shows: sinus bradycardia with frequent PACs and atrial couplets.  Complications: None Patient did tolerate procedure well.  Comprehensive post-procedure device check shows normal function. Reprogrammed DDD to reduce atrial ectopy and enhance chances of maintaining normal rhythm. Discussed w Dr. Caryl Comes, will increase sotalol to 80 mg BID. Will arrange follow up to assess durability of cardioversion and re-measure QT interval next week.  Time Spent Directly with the Patient:  30 minutes   Aimee Edwards 05/02/2019, 9:09 AM

## 2019-05-02 NOTE — Discharge Instructions (Signed)

## 2019-05-02 NOTE — Anesthesia Procedure Notes (Signed)
Procedure Name: MAC Performed by: Lowen Barringer B, CRNA Pre-anesthesia Checklist: Patient identified, Emergency Drugs available, Suction available, Patient being monitored and Timeout performed Patient Re-evaluated:Patient Re-evaluated prior to induction Oxygen Delivery Method: Nasal cannula Preoxygenation: Pre-oxygenation with 100% oxygen Induction Type: IV induction Placement Confirmation: positive ETCO2 Dental Injury: Teeth and Oropharynx as per pre-operative assessment        

## 2019-05-02 NOTE — Progress Notes (Signed)
Moderately hypotensive after sedation/DCCV. Now BP in 90s/50s, which she reports is normal for her. Since we are increasing the sotalol dose, will DC the metoprolol.

## 2019-05-02 NOTE — Op Note (Signed)
INDICATIONS: Atrial fibrillation pre-cardioversion  PROCEDURE:   Informed consent was obtained prior to the procedure. The risks, benefits and alternatives for the procedure were discussed and the patient comprehended these risks.  Risks include, but are not limited to, cough, sore throat, vomiting, nausea, somnolence, esophageal and stomach trauma or perforation, bleeding, low blood pressure, aspiration, pneumonia, infection, trauma to the teeth and death.    After a procedural time-out, the oropharynx was anesthetized with 20% benzocaine spray.   During this procedure the patient was administered IV propofol by Anesthesiology, Dr. Doroteo Glassman.  The transesophageal probe was inserted in the esophagus and stomach without difficulty and multiple views were obtained.  The patient was kept under observation until the patient left the procedure room.  The patient left the procedure room in stable condition.   Agitated microbubble saline contrast was not administered.  COMPLICATIONS:    There were no immediate complications.  FINDINGS:  Dilated left atrium. No left atrial thrombus. Sequelae of septal myectomy, systolic dyssynchrony,otw normal LV wall motion and EF. 1+ AI, 1+ MR, 3-4+ TR, probably lead-related.  RECOMMENDATIONS:    Proceed w cardioversion  Time Spent Directly with the Patient:  30 minutes   Aimee Edwards 05/02/2019, 9:05 AM

## 2019-05-02 NOTE — Progress Notes (Signed)
  Echocardiogram Echocardiogram Transesophageal has been performed.  Aimee Edwards 05/02/2019, 9:29 AM

## 2019-05-02 NOTE — Progress Notes (Signed)
Discuss BP 69/38 with Dr. Sallyanne Kuster, advised patient has had total of 1L normal saline bolus. Patient is easy to arouse, mentating appropriately, skin is warm, dry and pink, denies chest pain or shortness of breath. Verbal order for for 534mL bolus. Will continue to monitor.

## 2019-05-02 NOTE — Transfer of Care (Signed)
Immediate Anesthesia Transfer of Care Note  Patient: Aimee Edwards  Procedure(s) Performed: TRANSESOPHAGEAL ECHOCARDIOGRAM (TEE) (N/A ) CARDIOVERSION (N/A )  Patient Location: Endoscopy Unit  Anesthesia Type:MAC and General  Level of Consciousness: drowsy  Airway & Oxygen Therapy: Patient Spontanous Breathing and Patient connected to nasal cannula oxygen  Post-op Assessment: Report given to RN and Post -op Vital signs reviewed and stable  Post vital signs: Reviewed and stable  Last Vitals:  Vitals Value Taken Time  BP 76/28 05/02/19 0914  Temp    Pulse 40 05/02/19 0916  Resp 21 05/02/19 0916  SpO2 98 % 05/02/19 0916  Vitals shown include unvalidated device data.  Last Pain:  Vitals:   05/02/19 0743  TempSrc: Temporal  PainSc: 0-No pain         Complications: No apparent anesthesia complications

## 2019-05-03 NOTE — Progress Notes (Signed)
Attempted, unable to leave message 

## 2019-05-04 NOTE — Telephone Encounter (Addendum)
Pt called to update she is currently admitted to hospital in [place] since 12/9 for fluid overload - receiving "lots of lasix" stated lungs were full of fluid. Has appt 12/15 she was instructed to keep this follow up per physicians in [facility]. Pt wanted to make Dr. Caryl Comes and Adline Peals PA aware of her current status.

## 2019-05-08 ENCOUNTER — Encounter (HOSPITAL_COMMUNITY): Payer: Self-pay | Admitting: Physician Assistant

## 2019-05-08 ENCOUNTER — Ambulatory Visit (HOSPITAL_COMMUNITY)
Admission: RE | Admit: 2019-05-08 | Discharge: 2019-05-08 | Disposition: A | Discharge status: Home / Self Care | Payer: Medicare Other | Source: Ambulatory Visit | Attending: Physician Assistant | Admitting: Physician Assistant

## 2019-05-08 ENCOUNTER — Other Ambulatory Visit: Payer: Self-pay

## 2019-05-08 VITALS — BP 110/70 | HR 57 | Ht 62.0 in | Wt 150.0 lb

## 2019-05-08 DIAGNOSIS — E785 Hyperlipidemia, unspecified: Secondary | ICD-10-CM | POA: Diagnosis not present

## 2019-05-08 DIAGNOSIS — I491 Atrial premature depolarization: Secondary | ICD-10-CM | POA: Diagnosis not present

## 2019-05-08 DIAGNOSIS — Z951 Presence of aortocoronary bypass graft: Secondary | ICD-10-CM | POA: Insufficient documentation

## 2019-05-08 DIAGNOSIS — G4733 Obstructive sleep apnea (adult) (pediatric): Secondary | ICD-10-CM | POA: Diagnosis not present

## 2019-05-08 DIAGNOSIS — Z888 Allergy status to other drugs, medicaments and biological substances status: Secondary | ICD-10-CM | POA: Diagnosis not present

## 2019-05-08 DIAGNOSIS — Z7901 Long term (current) use of anticoagulants: Secondary | ICD-10-CM | POA: Insufficient documentation

## 2019-05-08 DIAGNOSIS — I480 Paroxysmal atrial fibrillation: Secondary | ICD-10-CM | POA: Diagnosis present

## 2019-05-08 DIAGNOSIS — D6869 Other thrombophilia: Secondary | ICD-10-CM | POA: Diagnosis not present

## 2019-05-08 DIAGNOSIS — I422 Other hypertrophic cardiomyopathy: Secondary | ICD-10-CM | POA: Diagnosis not present

## 2019-05-08 DIAGNOSIS — Z79899 Other long term (current) drug therapy: Secondary | ICD-10-CM | POA: Insufficient documentation

## 2019-05-08 DIAGNOSIS — I517 Cardiomegaly: Secondary | ICD-10-CM | POA: Diagnosis not present

## 2019-05-08 DIAGNOSIS — Z885 Allergy status to narcotic agent status: Secondary | ICD-10-CM | POA: Diagnosis not present

## 2019-05-08 NOTE — Addendum Note (Signed)
Encounter addended by: Oliver Barre, PA on: 05/08/2019 3:14 PM  Actions taken: Clinical Note Signed

## 2019-05-08 NOTE — Progress Notes (Addendum)
Primary Care Physician: Ball, [name], MD Primary Electrophysiologist: Dr Graciela HusbandsKlein Referring Physician: Dr Gillermina HuKlein[facility]   Aimee DrummerVirginia D Edwards is a 73 y.o. female with a history of hypertrophic cardiomyopathy status post septal myectomy, ICD implantation, OSA. and paroxysmal atrial fibrillation who [facility]. Patient reports that on the night of 04/24/19 she fell asleep and missed a dose of her metoprolol. She began having elevated heart rates between 100-120s associated with SOB and fatigue.  She sent in a remote device transmission which did show afib with brief episodes of RVR. She remains in afib today. She does have a diagnosis of OSA and has not been on CPAP for 5 years. There were no other specific triggers that the patient could identify. She is on coumadin for a CHADS2VASC score of 3.  On follow up today, patient is s/p TEE/DCCV on 05/02/19. Patient reports that later that evening [facility] and was found to be fluid overloaded. She was hospitalized for 3 days and was diuresed. Today, she reports that she feels well with no heart racing or palpitations. She denies any symptoms of fluid overload. Her energy level has improved but is not quite back to baseline.  Today, she denies symptoms of palpitations, chest pain, orthopnea, PND, lower extremity edema, dizziness, presyncope, syncope, snoring, daytime somnolence, bleeding, or neurologic sequela. The patient is tolerating medications without difficulties and is otherwise without complaint today.    Atrial Fibrillation Risk Factors:  she does have symptoms or diagnosis of sleep apnea. she is not compliant with CPAP therapy. she does not have a history of rheumatic fever. she does not have a history of alcohol use. The patient does have a history of early familial atrial fibrillation or other arrhythmias.  she has a BMI of Body mass index is  27.44 kg/m.Marland Kitchen. Filed Weights   05/08/19 1355  Weight: 68 kg    Family History  Problem Relation Age of Onset  . Alzheimer's disease Mother   . Emphysema Father   . Diabetes Father   . Heart disease Brother   . Cancer Brother   . Diabetes Paternal Grandmother   . Cardiomyopathy Other        several in family have had this      Atrial Fibrillation Management history:  Previous antiarrhythmic drugs: sotalol Previous cardioversions: 05/02/19 Previous ablations: none CHADS2VASC score: 3 Anticoagulation history: warfarin   Past Medical History:  Diagnosis Date  . Anxiety   . Atrial fibrillation (HCC)   . Back pain   . Cardiomyopathy, hypertrophic obstructive (HCC)   . Complication of anesthesia   . Degeneration of cervical intervertebral disc   . Dyslipidemia   . Edema   . Gastroesophageal reflux disease   . Hernia, abdominal   . Hypokalemia   . ICD (implantable cardioverter-defibrillator) in place   . Mitral regurgitation   . Pinched nerve   . PONV (postoperative nausea and vomiting)   . Presence of permanent cardiac pacemaker   . Reflux   . Right shoulder pain   . Shortness of breath on exertion   . Sleep apnea    Past Surgical History:  Procedure Laterality Date  . CARDIAC CATHETERIZATION    . CARDIOVERSION N/A 05/02/2019   Procedure: CARDIOVERSION;  Surgeon: Thurmon Fairroitoru, Mihai, MD;  Location: [facility] ENDOSCOPY;  Service: Cardiovascular;  Laterality: N/A;  . COLONSCOPY    . CORONARY ARTERY BYPASS GRAFT  pt denies this. She said she never had bypass surgery.  . ICD GENERATOR CHANGE    . ICD/BIV ICD FUNCTION(DFT) TEST N/A 06/26/2018   Procedure: ICD/BIV ICD FUNCTION (DFT) TEST;  Surgeon: Marinus Maw, MD;  Location: [facility] INVASIVE CV LAB;  Service: Cardiovascular;  Laterality: N/A;  . MYOMECTOMY    . OTHER HEART/PERICARD OPS    . PACEMAKER INSERTION    . TEE WITHOUT CARDIOVERSION N/A 05/02/2019   Procedure: TRANSESOPHAGEAL ECHOCARDIOGRAM (TEE);  Surgeon: Thurmon Fair, MD;  Location: [facility] ENDOSCOPY;  Service: Cardiovascular;  Laterality: N/A;  . TONSILLECTOMY    . VAGINAL HYSTERECTOMY      Current Outpatient Medications  Medication Sig Dispense Refill  . atorvastatin (LIPITOR) 40 MG tablet TAKE ONE TABLET BY MOUTH DAILY AT 6 PM (Patient taking differently: Take 40 mg by mouth every evening. ) 90 tablet 2  . Carboxymethylcellulose Sodium (ARTIFICIAL TEARS OP) Place 1 drop into both eyes daily as needed (dry eyes).    . furosemide (LASIX) 40 MG tablet Take 20-40 mg by mouth daily as needed for edema.     . magnesium oxide (MAG-OX) 400 MG tablet Take 1 tablet (400 mg total) by mouth daily. (Patient taking differently: Take 400 mg by mouth every evening. ) 90 tablet 3  . meclizine (ANTIVERT) 25 MG tablet Take 25 mg by mouth 3 (three) times daily as needed for dizziness.     . potassium chloride (KLOR-CON) 10 MEQ tablet Take 10 mEq by mouth daily.    . sotalol (BETAPACE) 80 MG tablet Take 1 tablet (80 mg total) by mouth 2 (two) times daily. 180 tablet 3  . spironolactone (ALDACTONE) 25 MG tablet Take 0.5 tablets (12.5 mg total) by mouth daily. 90 tablet 3  . warfarin (COUMADIN) 3 MG tablet Take 3 mg by mouth every evening.    . warfarin (COUMADIN) 5 MG tablet Take 1 tablet (5 mg) by mouth daily as directed by the coumadin clinic 30 tablet 2   No current facility-administered medications for this encounter.    Allergies  Allergen Reactions  . Codeine Nausea And Vomiting  . Lortab [Hydrocodone-Acetaminophen] Nausea And Vomiting    Social History   Socioeconomic History  . Marital status: Divorced    Spouse name: Not on file  . Number of children: 3  . Years of education: Not on file  . Highest education level: Some college, no degree  Occupational History  . Not on file  Tobacco Use  . Smoking status: Never Smoker  . Smokeless tobacco: Never Used  Substance and Sexual Activity  . Alcohol use: Not Currently    Comment: "once in awhile"  .  Drug use: Never  . Sexual activity: Not Currently  Other Topics Concern  . Not on file  Social History Narrative   Lives at home alone    Right handed   Caffeine: quit Jan 2020   Social Determinants of Health   Financial Resource Strain:   . Difficulty of Paying Living Expenses: Not on file  Food Insecurity:   . Worried About Programme researcher, broadcasting/film/video in the Last Year: Not on file  . Ran Out of Food in the Last Year: Not on file  Transportation Needs:   . Lack of Transportation (Medical): Not on file  . Lack of Transportation (Non-Medical): Not on file  Physical Activity:   . Days of Exercise per Week: Not on file  . Minutes of Exercise per Session: Not on file  Stress:   .  Feeling of Stress : Not on file  Social Connections:   . Frequency of Communication with Friends and Family: Not on file  . Frequency of Social Gatherings with Friends and Family: Not on file  . Attends Religious Services: Not on file  . Active Member of Clubs or Organizations: Not on file  . Attends Archivist Meetings: Not on file  . Marital Status: Not on file  Intimate Partner Violence:   . Fear of Current or Ex-Partner: Not on file  . Emotionally Abused: Not on file  . Physically Abused: Not on file  . Sexually Abused: Not on file     ROS- All systems are reviewed and negative except as per the HPI above.  Physical Exam: Vitals:   05/08/19 1355  BP: 110/70  Pulse: (!) 57  Weight: 68 kg  Height: 5\' 2"  (1.575 m)    GEN- The patient is well appearing, alert and oriented x 3 today.   HEENT-head normocephalic, atraumatic, sclera clear, conjunctiva pink, hearing intact, trachea midline. Lungs- Clear to ausculation bilaterally, normal work of breathing Heart- Regular rate and rhythm, occasional ectopic beat, no murmurs, rubs or gallops  GI- soft, NT, ND, + BS Extremities- no clubbing, cyanosis, or edema MS- no significant deformity or atrophy Skin- no rash or lesion Psych- euthymic mood,  full affect Neuro- strength and sensation are intact   Wt Readings from Last 3 Encounters:  05/08/19 68 kg  05/02/19 68.9 kg  04/26/19 69.2 kg    EKG today demonstrates SB HR 57, PACs, LVH with repol abnormality, PR 190, QRS 148, QTc 552 (manually calculated 507)  Echo 06/23/18 demonstrated  1. The left ventricle has LVEF is approximately 45% with hypokinesis/akinesis of the inferior, inferoseptal walls. The cavity size is mild to moderately increased. There is no left ventricular wall thickness. Echo evidence of impaired relaxation  diastolic filling patterns.  2. Moderately dilated left atrial size.  3. Normal right atrial size.  4. The mitral valve normal in structure. Regurgitation is mild by color flow Doppler.  5. Normal tricuspid valve.  6. Tricuspid regurgitation is mild.  7. The aortic valve tricuspid. There is mild thickening and mild calcification of the aortic valve. Aortic valve regurgitation is mild by color flow Doppler.  8. No atrial level shunt detected by color flow Doppler.  Epic records are reviewed at length today  Assessment and Plan:  1. Paroxysmal atrial fibrillation S/p TEE/DCCV on 05/02/19. Patient appears to be maintaining SR. Continue sotalol 80 mg BID.  Lifestyle changes as below.  This patients CHA2DS2-VASc Score and unadjusted Ischemic Stroke Rate (% per year) is equal to 3.2 % stroke rate/year from a score of 3  Above score calculated as 1 point each if present [CHF, HTN, DM, Vascular=MI/PAD/Aortic Plaque, Age if 65-74, or Female] Above score calculated as 2 points each if present [Age > 75, or Stroke/TIA/TE]   2. Obstructive sleep apnea The importance of adequate treatment of sleep apnea was discussed today in order to improve our ability to maintain sinus rhythm long term. Patient has appointment with PCP and will discuss resuming CPAP therapy.  3. Hypertrophic obstructive cardiomyopathy S/p myectomy 1996. Followed by Dr Caryl Comes.  4. Chronic  CHF Patient had acute fluid overload post DCCV. Diuresed at [facility]. No signs or symptoms of fluid overload today. Weight stable. Daily weights 2 gram sodium diet.   Follow up with Dr Caryl Comes per recall. AF clinic in 6 months.  Addendum: QTc calculated at  534 ms, appropriate with ICD in place.   Jorja Loa PA-C [facility] [address] [place], [place] [address] [phone] 05/08/2019 2:47 PM

## 2019-05-12 ENCOUNTER — Other Ambulatory Visit: Payer: Self-pay | Admitting: Internal Medicine

## 2019-06-01 NOTE — Progress Notes (Signed)
ICD remote 

## 2019-07-31 ENCOUNTER — Ambulatory Visit (INDEPENDENT_AMBULATORY_CARE_PROVIDER_SITE_OTHER): Payer: Medicare Other | Admitting: *Deleted

## 2019-07-31 DIAGNOSIS — I422 Other hypertrophic cardiomyopathy: Secondary | ICD-10-CM

## 2019-07-31 LAB — CUP PACEART REMOTE DEVICE CHECK
Battery Remaining Longevity: 24 mo
Battery Remaining Percentage: 30 %
Brady Statistic RA Percent Paced: 0 %
Brady Statistic RV Percent Paced: 0 %
Date Time Interrogation Session: 20210309005000
HighPow Impedance: 62 Ohm
Implantable Lead Implant Date: 19970916
Implantable Lead Implant Date: 19970916
Implantable Lead Location: 753859
Implantable Lead Location: 753860
Implantable Lead Model: 125
Implantable Lead Model: 4269
Implantable Lead Serial Number: 222062
Implantable Lead Serial Number: 279972
Implantable Pulse Generator Implant Date: 20101020
Lead Channel Impedance Value: 449 Ohm
Lead Channel Pacing Threshold Amplitude: 1.1 V
Lead Channel Pacing Threshold Amplitude: 4.5 V
Lead Channel Pacing Threshold Pulse Width: 0.4 ms
Lead Channel Pacing Threshold Pulse Width: 1 ms
Lead Channel Setting Pacing Amplitude: 2.6 V
Lead Channel Setting Pacing Pulse Width: 0.4 ms
Lead Channel Setting Sensing Sensitivity: 0.6 mV
Pulse Gen Serial Number: 149386

## 2019-08-01 NOTE — Progress Notes (Signed)
ICD Remote  

## 2019-08-19 ENCOUNTER — Other Ambulatory Visit: Payer: Self-pay | Admitting: Internal Medicine

## 2019-08-23 MED ORDER — ATORVASTATIN CALCIUM 20 MG PO TABS
40.00 | ORAL_TABLET | ORAL | Status: DC
Start: 2019-08-23 — End: 2019-08-23

## 2019-08-23 MED ORDER — ONDANSETRON HCL 4 MG/2ML IJ SOLN
4.00 | INTRAMUSCULAR | Status: DC
Start: ? — End: 2019-08-23

## 2019-08-23 MED ORDER — GENERIC EXTERNAL MEDICATION
Status: DC
Start: ? — End: 2019-08-23

## 2019-08-23 MED ORDER — SPIRONOLACTONE 25 MG PO TABS
12.50 | ORAL_TABLET | ORAL | Status: DC
Start: 2019-08-24 — End: 2019-08-23

## 2019-08-23 MED ORDER — SOTALOL HCL 80 MG PO TABS
80.00 | ORAL_TABLET | ORAL | Status: DC
Start: 2019-08-23 — End: 2019-08-23

## 2019-08-23 MED ORDER — MELATONIN 3 MG PO TABS
6.00 | ORAL_TABLET | ORAL | Status: DC
Start: ? — End: 2019-08-23

## 2019-08-23 MED ORDER — POTASSIUM CHLORIDE CRYS ER 10 MEQ PO TBCR
20.00 | EXTENDED_RELEASE_TABLET | ORAL | Status: DC
Start: 2019-08-24 — End: 2019-08-23

## 2019-08-23 MED ORDER — PANTOPRAZOLE SODIUM 40 MG PO TBEC
40.00 | DELAYED_RELEASE_TABLET | ORAL | Status: DC
Start: 2019-08-24 — End: 2019-08-23

## 2019-08-23 MED ORDER — ALUM & MAG HYDROXIDE-SIMETH 200-200-20 MG/5ML PO SUSP
30.00 | ORAL | Status: DC
Start: ? — End: 2019-08-23

## 2019-08-23 MED ORDER — FUROSEMIDE 10 MG/ML IJ SOLN
40.00 | INTRAMUSCULAR | Status: DC
Start: 2019-08-23 — End: 2019-08-23

## 2019-08-23 MED ORDER — GENERIC EXTERNAL MEDICATION
12.50 | Status: DC
Start: ? — End: 2019-08-23

## 2019-08-24 ENCOUNTER — Telehealth: Payer: Self-pay | Admitting: Internal Medicine

## 2019-08-24 NOTE — Telephone Encounter (Signed)
New message      Calling to let the nurse know that she has been discharged from the hospital in [place] but she is still in AFIB.  They did not have a cardiologist on staff.  She need to know what to do since she is still in AFIB.  Please call.

## 2019-08-24 NOTE — Telephone Encounter (Signed)
Returned call to patient. Her HR is in the 70-80s her breathing is better since being discharged after diuresis. Her weight today is 148lbs. Pt still feels she is in afib. Pt states they did perform an echo but she is unsure if they appropriately did it because they were finished in less than 10 minutes. So she is very upset that her EF is showing at 25% from that echocardiogram. Offered pt appointment for Monday with Jorja Loa PA pt in agreement.

## 2019-08-27 ENCOUNTER — Ambulatory Visit (HOSPITAL_COMMUNITY)
Admission: RE | Admit: 2019-08-27 | Discharge: 2019-08-27 | Disposition: A | Discharge status: Home / Self Care | Payer: Medicare Other | Source: Ambulatory Visit | Attending: Physician Assistant | Admitting: Physician Assistant

## 2019-08-27 ENCOUNTER — Other Ambulatory Visit: Payer: Self-pay

## 2019-08-27 ENCOUNTER — Encounter (HOSPITAL_COMMUNITY): Payer: Self-pay | Admitting: Physician Assistant

## 2019-08-27 VITALS — BP 116/74 | HR 64 | Ht 62.0 in | Wt 147.0 lb

## 2019-08-27 DIAGNOSIS — G4733 Obstructive sleep apnea (adult) (pediatric): Secondary | ICD-10-CM | POA: Diagnosis not present

## 2019-08-27 DIAGNOSIS — Z95810 Presence of automatic (implantable) cardiac defibrillator: Secondary | ICD-10-CM | POA: Diagnosis not present

## 2019-08-27 DIAGNOSIS — Z885 Allergy status to narcotic agent status: Secondary | ICD-10-CM | POA: Insufficient documentation

## 2019-08-27 DIAGNOSIS — Z8249 Family history of ischemic heart disease and other diseases of the circulatory system: Secondary | ICD-10-CM | POA: Insufficient documentation

## 2019-08-27 DIAGNOSIS — I480 Paroxysmal atrial fibrillation: Secondary | ICD-10-CM | POA: Diagnosis not present

## 2019-08-27 DIAGNOSIS — I421 Obstructive hypertrophic cardiomyopathy: Secondary | ICD-10-CM | POA: Insufficient documentation

## 2019-08-27 DIAGNOSIS — I5020 Unspecified systolic (congestive) heart failure: Secondary | ICD-10-CM | POA: Diagnosis not present

## 2019-08-27 DIAGNOSIS — Z79899 Other long term (current) drug therapy: Secondary | ICD-10-CM | POA: Diagnosis not present

## 2019-08-27 DIAGNOSIS — Z7901 Long term (current) use of anticoagulants: Secondary | ICD-10-CM | POA: Diagnosis not present

## 2019-08-27 DIAGNOSIS — Z951 Presence of aortocoronary bypass graft: Secondary | ICD-10-CM | POA: Diagnosis not present

## 2019-08-27 DIAGNOSIS — D6869 Other thrombophilia: Secondary | ICD-10-CM | POA: Diagnosis not present

## 2019-08-27 NOTE — Progress Notes (Signed)
Primary Care Physician: Nelda Bucks, MD Primary Electrophysiologist: Dr Graciela Husbands Referring Physician: Dr Gillermina [facility] clinic   Aimee Edwards is a 74 y.o. female with a history of hypertrophic cardiomyopathy status post septal myectomy, ICD implantation, OSA. and paroxysmal atrial fibrillation who presents for follow up in the [facility]. Patient reports that on the night of 04/24/19 she fell asleep and missed a dose of her metoprolol. She began having elevated heart rates between 100-120s associated with SOB and fatigue.  She sent in a remote device transmission which did show afib with brief episodes of RVR. She remains in afib today. She does have a diagnosis of OSA and has not been on CPAP for 5 years. There were no other specific triggers that the patient could identify. She is on coumadin for a CHADS2VASC score of 3. Patient is s/p TEE/DCCV on 05/02/19. Patient reports that later that evening she presented to [facility] with SOB and was found to be fluid overloaded. She was hospitalized for 3 days and was diuresed.   On follow up today, patient reports that she felt she was back in afib with symptoms of palpitations and SOB and she presented to the ED at the [facility] on 08/22/19. She was found to be in afib with RVR and a BNP of 2800. She was diuresed. Her sotalol was held initially with low BP but this was resumed prior to discharge. She feels she converted to SR 08/24/19. She did have an echo which showed reduced EF 25-30%. Today, she reports that she feels very well and is back to her baseline. She is in SR. Of note, she was being treated for shingles at the time of her afib episode.   Today, she denies symptoms of palpitations, chest pain, orthopnea, PND, lower extremity edema, dizziness, presyncope, syncope, snoring, daytime somnolence, bleeding, or neurologic sequela. The patient is tolerating medications without difficulties and is otherwise  without complaint today.    Atrial Fibrillation Risk Factors:  she does have symptoms or diagnosis of sleep apnea. she is not compliant with CPAP therapy. she does not have a history of rheumatic fever. she does not have a history of alcohol use. The patient does have a history of early familial atrial fibrillation or other arrhythmias.  she has a BMI of Body mass index is 26.89 kg/m.Marland Kitchen Filed Weights   08/27/19 1428  Weight: 66.7 kg    Family History  Problem Relation Age of Onset  . Alzheimer's disease Mother   . Emphysema Father   . Diabetes Father   . Heart disease Brother   . Cancer Brother   . Diabetes Paternal Grandmother   . Cardiomyopathy Other        several in family have had this      Atrial Fibrillation Management history:  Previous antiarrhythmic drugs: amiodarone, sotalol Previous cardioversions: 05/02/19 Previous ablations: none CHADS2VASC score: 3 Anticoagulation history: warfarin   Past Medical History:  Diagnosis Date  . Anxiety   . Atrial fibrillation (HCC)   . Back pain   . Cardiomyopathy, hypertrophic obstructive (HCC)   . Complication of anesthesia   . Degeneration of cervical intervertebral disc   . Dyslipidemia   . Edema   . Gastroesophageal reflux disease   . Hernia, abdominal   . Hypokalemia   . ICD (implantable cardioverter-defibrillator) in place   . Mitral regurgitation   . Pinched nerve   . PONV (postoperative nausea and vomiting)   . Presence of  permanent cardiac pacemaker   . Reflux   . Right shoulder pain   . Shortness of breath on exertion   . Sleep apnea    Past Surgical History:  Procedure Laterality Date  . CARDIAC CATHETERIZATION    . CARDIOVERSION N/A 05/02/2019   Procedure: CARDIOVERSION;  Surgeon: Sanda Klein, MD;  Location: [facility] ENDOSCOPY;  Service: Cardiovascular;  Laterality: N/A;  . COLONSCOPY    . CORONARY ARTERY BYPASS GRAFT     pt denies this. She said she never had bypass surgery.  . ICD GENERATOR  CHANGE    . ICD/BIV ICD FUNCTION(DFT) TEST N/A 06/26/2018   Procedure: ICD/BIV ICD FUNCTION (DFT) TEST;  Surgeon: Evans Lance, MD;  Location: [place] CV LAB;  Service: Cardiovascular;  Laterality: N/A;  . MYOMECTOMY    . OTHER HEART/PERICARD OPS    . PACEMAKER INSERTION    . TEE WITHOUT CARDIOVERSION N/A 05/02/2019   Procedure: TRANSESOPHAGEAL ECHOCARDIOGRAM (TEE);  Surgeon: Sanda Klein, MD;  Location: [facility] ENDOSCOPY;  Service: Cardiovascular;  Laterality: N/A;  . TONSILLECTOMY    . VAGINAL HYSTERECTOMY      Current Outpatient Medications  Medication Sig Dispense Refill  . atorvastatin (LIPITOR) 40 MG tablet TAKE ONE TABLET BY MOUTH DAILY AT 6 PM (Patient taking differently: Take 40 mg by mouth every evening. ) 90 tablet 2  . Carboxymethylcellulose Sodium (ARTIFICIAL TEARS OP) Place 1 drop into both eyes daily as needed (dry eyes).    . furosemide (LASIX) 40 MG tablet Take 20-40 mg by mouth daily as needed for edema.     . magnesium oxide (MAG-OX) 400 MG tablet Take 1 tablet (400 mg total) by mouth daily. (Patient taking differently: Take 400 mg by mouth every evening. ) 90 tablet 3  . meclizine (ANTIVERT) 25 MG tablet Take 25 mg by mouth 3 (three) times daily as needed for dizziness.     . potassium chloride (KLOR-CON) 10 MEQ tablet TAKE ONE TABLET BY MOUTH DAILY 90 tablet 1  . sotalol (BETAPACE) 80 MG tablet Take 1 tablet (80 mg total) by mouth 2 (two) times daily. 180 tablet 3  . spironolactone (ALDACTONE) 25 MG tablet TAKE ONE-HALF TABLET BY MOUTH DAILY 45 tablet 1  . warfarin (COUMADIN) 3 MG tablet Take 3 mg by mouth every evening.    . warfarin (COUMADIN) 5 MG tablet Take 1 tablet (5 mg) by mouth daily as directed by the coumadin clinic 30 tablet 2   No current facility-administered medications for this encounter.    Allergies  Allergen Reactions  . Codeine Nausea And Vomiting  . Lortab [Hydrocodone-Acetaminophen] Nausea And Vomiting    Social History   Socioeconomic  History  . Marital status: Divorced    Spouse name: Not on file  . Number of children: 3  . Years of education: Not on file  . Highest education level: Some college, no degree  Occupational History  . Not on file  Tobacco Use  . Smoking status: Never Smoker  . Smokeless tobacco: Never Used  Substance and Sexual Activity  . Alcohol use: Not Currently    Comment: "once in awhile"  . Drug use: Never  . Sexual activity: Not Currently  Other Topics Concern  . Not on file  Social History Narrative   Lives at home alone    Right handed   Caffeine: quit Jan 2020   Social Determinants of Health   Financial Resource Strain:   . Difficulty of Paying Living Expenses:   Food Insecurity:   .  Worried About Programme researcher, broadcasting/film/video in the Last Year:   . Barista in the Last Year:   Transportation Needs:   . Freight forwarder (Medical):   Marland Kitchen Lack of Transportation (Non-Medical):   Physical Activity:   . Days of Exercise per Week:   . Minutes of Exercise per Session:   Stress:   . Feeling of Stress :   Social Connections:   . Frequency of Communication with Friends and Family:   . Frequency of Social Gatherings with Friends and Family:   . Attends Religious Services:   . Active Member of Clubs or Organizations:   . Attends Banker Meetings:   Marland Kitchen Marital Status:   Intimate Partner Violence:   . Fear of Current or Ex-Partner:   . Emotionally Abused:   Marland Kitchen Physically Abused:   . Sexually Abused:      ROS- All systems are reviewed and negative except as per the HPI above.  Physical Exam: Vitals:   08/27/19 1428  BP: 116/74  Pulse: 64  Weight: 66.7 kg  Height: 5\' 2"  (1.575 m)    GEN- The patient is well appearing elderly female, alert and oriented x 3 today.   HEENT-head normocephalic, atraumatic, sclera clear, conjunctiva pink, hearing intact, trachea midline. Lungs- Clear to ausculation bilaterally, normal work of breathing Heart- Regular rate and rhythm,  no murmurs, rubs or gallops  GI- soft, NT, ND, + BS Extremities- no clubbing, cyanosis, or edema MS- no significant deformity or atrophy Skin- no rash or lesion Psych- euthymic mood, full affect Neuro- strength and sensation are intact   Wt Readings from Last 3 Encounters:  08/27/19 66.7 kg  05/08/19 68 kg  05/02/19 68.9 kg    EKG today demonstrates SR HR 64, LVH, PR 184, QRS 152, QTc 528 (496 manually calculated)  Echo 06/23/18 demonstrated  1. The left ventricle has LVEF is approximately 45% with hypokinesis/akinesis of the inferior, inferoseptal walls. The cavity size is mild to moderately increased. There is no left ventricular wall thickness. Echo evidence of impaired relaxation  diastolic filling patterns.  2. Moderately dilated left atrial size.  3. Normal right atrial size.  4. The mitral valve normal in structure. Regurgitation is mild by color flow Doppler.  5. Normal tricuspid valve.  6. Tricuspid regurgitation is mild.  7. The aortic valve tricuspid. There is mild thickening and mild calcification of the aortic valve. Aortic valve regurgitation is mild by color flow Doppler.  8. No atrial level shunt detected by color flow Doppler.  Echo 08/23/19 [facility]  1. Overall left ventricular ejection fraction is estimated at 25 to 30%.  2. Severely decreased global left ventricular systolic function.  3. Left ventricular wall motion abnormalities exist. See wall motion findings.  4. Moderately increased left ventricular internal cavity size.  5. Moderately dilated left atrium.  6. Mildly dilated right atrium.  7. Moderate mitral valve regurgitation.  8. Mild-moderate tricuspid regurgitation.  9. Mild to moderate aortic regurgitation. 10. Mildly elevated pulmonary artery systolic pressure.  Epic records are reviewed at length today  Assessment and Plan:  1. Paroxysmal atrial fibrillation Recent episode requiring hospital stay. ? Related to shingles infection.   We discussed therapeutic options today. Patient prefers not to make any changes today. AAD options limited, recall she failed amiodarone previously 2/2 increased DFT with failed defibrillation. Continue sotalol 80 mg BID. QT stable. Mag and Bmet from hospital reviewed.   This patients CHA2DS2-VASc Score and unadjusted Ischemic  Stroke Rate (% per year) is equal to 3.2 % stroke rate/year from a score of 3  Above score calculated as 1 point each if present [CHF, HTN, DM, Vascular=MI/PAD/Aortic Plaque, Age if 65-74, or Female] Above score calculated as 2 points each if present [Age > 75, or Stroke/TIA/TE]  2. Obstructive sleep apnea The importance of adequate treatment of sleep apnea was discussed today in order to improve our ability to maintain sinus rhythm long term. Patient has appt with PCP next month and will discuss resuming CPAP.   3. Hypertrophic obstructive cardiomyopathy S/p myectomy 1996.  4. Systolic CHF EF on recent echo 25-30%.  No signs or symptoms of fluid overload today. Weight stable. She is already on Aldactone. No room in BP to add ACE/ARB as her BP at home usually runs 90s-100s systolic.  Would consider recheck in 2-3 months to see if EF recovered with SR.    Follow up with Dr Graciela Husbands in 2-3 months.    Jorja Loa PA-C Afib Clinic [facility] [address] [place], [place] [address] [phone] 08/27/2019 3:59 PM

## 2019-10-07 ENCOUNTER — Other Ambulatory Visit: Payer: Self-pay | Admitting: Internal Medicine

## 2019-10-24 ENCOUNTER — Other Ambulatory Visit: Payer: Self-pay

## 2019-10-25 MED ORDER — POTASSIUM CHLORIDE ER 10 MEQ PO TBCR: 10.0000 meq | EXTENDED_RELEASE_TABLET | Freq: Every day | ORAL | 0 refills | Status: DC

## 2019-10-30 ENCOUNTER — Ambulatory Visit (INDEPENDENT_AMBULATORY_CARE_PROVIDER_SITE_OTHER): Payer: Medicare Other | Admitting: *Deleted

## 2019-10-30 DIAGNOSIS — I472 Ventricular tachycardia, unspecified: Secondary | ICD-10-CM

## 2019-10-30 LAB — CUP PACEART REMOTE DEVICE CHECK
Battery Remaining Longevity: 18 mo
Battery Remaining Percentage: 26 %
Brady Statistic RA Percent Paced: 0 %
Brady Statistic RV Percent Paced: 0 %
Date Time Interrogation Session: 20210608011700
HighPow Impedance: 60 Ohm
Implantable Lead Implant Date: 19970916
Implantable Lead Implant Date: 19970916
Implantable Lead Location: 753859
Implantable Lead Location: 753860
Implantable Lead Model: 125
Implantable Lead Model: 4269
Implantable Lead Serial Number: 222062
Implantable Lead Serial Number: 279972
Implantable Pulse Generator Implant Date: 20101020
Lead Channel Impedance Value: 456 Ohm
Lead Channel Pacing Threshold Amplitude: 1.1 V
Lead Channel Pacing Threshold Amplitude: 4.5 V
Lead Channel Pacing Threshold Pulse Width: 0.4 ms
Lead Channel Pacing Threshold Pulse Width: 1 ms
Lead Channel Setting Pacing Amplitude: 2.6 V
Lead Channel Setting Pacing Pulse Width: 0.4 ms
Lead Channel Setting Sensing Sensitivity: 0.6 mV
Pulse Gen Serial Number: 149386

## 2019-10-31 NOTE — Progress Notes (Signed)
Remote ICD transmission.   

## 2019-11-05 ENCOUNTER — Telehealth: Payer: Self-pay | Admitting: Internal Medicine

## 2019-11-05 NOTE — Telephone Encounter (Signed)
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 Pt did not know what to tell her. No extractions at 1st exam.

## 2019-11-05 NOTE — Telephone Encounter (Signed)
Fax number is [phone]

## 2019-11-05 NOTE — Telephone Encounter (Signed)
1. What dental office are you calling from? Dr Caprice Kluver   2. What is your office phone number? [phone]  3. What is your fax number? Have email no fax- [email or web address]  4. What type of procedure is the patient having performed? Comphrensive Exam, possible cleaning, filling, not sure exactly   5. What date is procedure scheduled or is the patient there now? 01-07-20  (if the patient is at the dentist's office question goes to their cardiologist if he/she is in the office.  If not, question should go to the DOD).   6. What is your question (ex. Antibiotics prior to procedure, holding medication-we need to know how long dentist wants pt to hold med)? Does pt needs to be premedicated? If so what regimen does she need

## 2019-11-05 NOTE — Telephone Encounter (Signed)
° °  Primary Cardiologist: Sherryl Manges, MD  Chart reviewed as part of pre-operative protocol coverage. Simple dental extractions are considered low risk procedures per guidelines and generally do not require any specific cardiac clearance. It is also generally accepted that for simple extractions and dental cleanings, there is no need to interrupt blood thinner therapy.   SBE prophylaxis is not required for the patient.  I will route this recommendation to the requesting party via Epic fax function and remove from pre-op pool.  Please call with questions.  Ronney Asters, NP 11/05/2019, 4:31 PM

## 2019-11-06 ENCOUNTER — Ambulatory Visit (HOSPITAL_COMMUNITY): Payer: Medicare Other | Admitting: Physician Assistant

## 2019-11-07 NOTE — Telephone Encounter (Signed)
A transmission did come through around midnight.  Pt is in AF with fairly well controlled V rates.

## 2019-11-12 ENCOUNTER — Ambulatory Visit (INDEPENDENT_AMBULATORY_CARE_PROVIDER_SITE_OTHER): Payer: Medicare Other | Admitting: Internal Medicine

## 2019-11-12 ENCOUNTER — Other Ambulatory Visit: Payer: Self-pay

## 2019-11-12 ENCOUNTER — Encounter: Payer: Self-pay | Admitting: Internal Medicine

## 2019-11-12 VITALS — BP 104/66 | HR 54 | Ht 62.0 in | Wt 150.0 lb

## 2019-11-12 DIAGNOSIS — I480 Paroxysmal atrial fibrillation: Secondary | ICD-10-CM | POA: Diagnosis not present

## 2019-11-12 DIAGNOSIS — Z95810 Presence of automatic (implantable) cardiac defibrillator: Secondary | ICD-10-CM | POA: Diagnosis not present

## 2019-11-12 DIAGNOSIS — I472 Ventricular tachycardia, unspecified: Secondary | ICD-10-CM

## 2019-11-12 DIAGNOSIS — I421 Obstructive hypertrophic cardiomyopathy: Secondary | ICD-10-CM | POA: Diagnosis not present

## 2019-11-12 MED ORDER — POTASSIUM CHLORIDE ER 10 MEQ PO TBCR: 10.0000 meq | EXTENDED_RELEASE_TABLET | Freq: Every day | ORAL | 0 refills | Status: DC

## 2019-11-12 MED ORDER — MAGNESIUM OXIDE 400 MG PO TABS: 400.0000 mg | ORAL_TABLET | Freq: Every day | ORAL | 3 refills | Status: DC

## 2019-11-12 MED ORDER — ATORVASTATIN CALCIUM 40 MG PO TABS: ORAL_TABLET | ORAL | 3 refills | Status: DC

## 2019-11-12 MED ORDER — FUROSEMIDE 20 MG PO TABS: 20.0000 mg | ORAL_TABLET | Freq: Every day | ORAL | 3 refills | Status: AC

## 2019-11-12 MED ORDER — MAGNESIUM OXIDE 400 MG PO TABS: 400.0000 mg | ORAL_TABLET | Freq: Every day | ORAL | 3 refills | Status: AC

## 2019-11-12 MED ORDER — FUROSEMIDE 40 MG PO TABS: 40.0000 mg | ORAL_TABLET | Freq: Every day | ORAL | 3 refills | Status: AC

## 2019-11-12 NOTE — Progress Notes (Signed)
Patient Care Team: Nelda Bucks, MD as PCP - General (Family Medicine) Duke Salvia, MD as PCP - Cardiology (Cardiology)   HPI  Aimee Edwards is a 74 y.o. female Seen in follow-up for hypertrophic cardiomyopathy status post septal myectomy (1996) but she had nonsustained VT 1/2 years (VVI 40. She has a history of ICD implantation. She has had interval atrial fibrillation resulting in inappropriate ICD discharges.  Anticoagulation with warfarin  She has had problems with atrial lead with over sensing    DATE TEST EF   4/18 Echo   45-50 % Severe>>See Below --Moderate  11/19 Echo  60-65% Severe LAE  1/20 Echo  40.45%   1/20 LHC   Cors normal LVEDP 23  12/20 TEE 45-50% LAE Severe   Date Cr K Hgb  8/19 0.75 4.3 12.3  06/22/18 1.09 3.6    12/20 0.7 3.6 14.6   She has a history of mitral regurgitation with comments suggesting severe MR and moderate AI.  Have reviewed with PR >> mod-central  Medical management recommended   She was hospitalized [facility] because of an ICD discharges.  She was seen by EP felt to have had ventricular tachycardia (agree See Below) and started on amiodarone.   Device was reprogrammed VVI because atrial lead issues.  When seen following discharge, it was noted that she had failed to convert with maximum output.  She was hospitalized and started on sotalol 80 mg twice daily.  She underwent DFT testing with defibrillation threshold less than 21 J.  She has been maintained on low-dose sotalol now at 80 twice daily  12/20 underwent TEE guided cardioversion  Seen in A. fib clinic 4/21 following an ED visit with [facility] 3/21 where she was found to be in A. fib with a rapid rate and converted spontaneously.  Latitude transmission 11/07/2019 demonstrated atrial fibrillation  Is her impression she is having more atrial fibrillation.  And associated with shortness of breath.  Heart rate is are more elevated in the 100 range.  Some peripheral  edema.  Diet is salt restricted and fluid restricted           Past Medical History:  Diagnosis Date  . Anxiety   . Atrial fibrillation (HCC)   . Back pain   . Cardiomyopathy, hypertrophic obstructive (HCC)   . Complication of anesthesia   . Degeneration of cervical intervertebral disc   . Dyslipidemia   . Edema   . Gastroesophageal reflux disease   . Hernia, abdominal   . Hypokalemia   . ICD (implantable cardioverter-defibrillator) in place   . Mitral regurgitation   . Pinched nerve   . PONV (postoperative nausea and vomiting)   . Presence of permanent cardiac pacemaker   . Reflux   . Right shoulder pain   . Shortness of breath on exertion   . Sleep apnea     Past Surgical History:  Procedure Laterality Date  . CARDIAC CATHETERIZATION    . CARDIOVERSION N/A 05/02/2019   Procedure: CARDIOVERSION;  Surgeon: Thurmon Fair, MD;  Location: [facility] ENDOSCOPY;  Service: Cardiovascular;  Laterality: N/A;  . COLONSCOPY    . CORONARY ARTERY BYPASS GRAFT     pt denies this. She said she never had bypass surgery.  . ICD GENERATOR CHANGE    . ICD/BIV ICD FUNCTION(DFT) TEST N/A 06/26/2018   Procedure: ICD/BIV ICD FUNCTION (DFT) TEST;  Surgeon: Marinus Maw, MD;  Location: [facility] INVASIVE CV LAB;  Service: Cardiovascular;  Laterality:  N/A;  . MYOMECTOMY    . OTHER HEART/PERICARD OPS    . PACEMAKER INSERTION    . TEE WITHOUT CARDIOVERSION N/A 05/02/2019   Procedure: TRANSESOPHAGEAL ECHOCARDIOGRAM (TEE);  Surgeon: Sanda Latoyna Hird, MD;  Location: [facility] ENDOSCOPY;  Service: Cardiovascular;  Laterality: N/A;  . TONSILLECTOMY    . VAGINAL HYSTERECTOMY      Current Outpatient Medications  Medication Sig Dispense Refill  . atorvastatin (LIPITOR) 40 MG tablet TAKE ONE TABLET BY MOUTH DAILY AT 6 PM 90 tablet 2  . Carboxymethylcellulose Sodium (ARTIFICIAL TEARS OP) Place 1 drop into both eyes daily as needed (dry eyes).    . furosemide (LASIX) 20 MG tablet Take 20 mg by mouth at bedtime.    .  furosemide (LASIX) 40 MG tablet Take 40 mg by mouth daily.     . magnesium oxide (MAG-OX) 400 MG tablet Take 1 tablet (400 mg total) by mouth daily. 90 tablet 3  . meclizine (ANTIVERT) 25 MG tablet Take 25 mg by mouth 3 (three) times daily as needed for dizziness.     . potassium chloride (KLOR-CON) 10 MEQ tablet Take 1 tablet (10 mEq total) by mouth daily. Please keep f/u appt. with Dr Caryl Comes on 6/21 90 tablet 0  . sotalol (BETAPACE) 80 MG tablet Take 1 tablet (80 mg total) by mouth 2 (two) times daily. 180 tablet 3  . spironolactone (ALDACTONE) 25 MG tablet TAKE ONE-HALF TABLET BY MOUTH DAILY 45 tablet 1  . warfarin (COUMADIN) 3 MG tablet Take 3 mg by mouth every evening.     No current facility-administered medications for this visit.    Allergies  Allergen Reactions  . Codeine Nausea And Vomiting  . Lortab [Hydrocodone-Acetaminophen] Nausea And Vomiting     Review of Systems negative except from HPI and PMH  Physical Exam   BP 104/66   Pulse (!) 54   Ht 5\' 2"  (1.575 m)   Wt 150 lb (68 kg)   SpO2 96%   BMI 27.44 kg/m   Well developed and well nourished in no acute distress HENT normal Neck supple with JVP-6-8 cm Clear Device pocket well healed; without hematoma or erythema.  There is no tethering  Regular rate and rhythm, 2/6 systolic murmur Abd-soft with active BS No Clubbing cyanosis trace edema Skin-warm and dry A & Oriented  Grossly normal sensory and motor function  ECG sinus at 54 Intervals 20/15/53       Assessment and  Plan Ventricular Tachycardia  Elevated defibrillation thresholds prompting the introduction of sotalol with adequate DFTs thereafter and discontinuation of amiodarone  Hypertrophic obstructive cardiomyopathy status post septal myectomy 1996  Implantable defibrillator 1996? Indication The patient's device was interrogated.  The information was reviewed. No changes were made in the programming.    Atrial fibrillation-paroxysmal-recurrent  with inappropriate ICD shocks  Congestive heart failure- -class IIb  Mitral regurgitation/aortic regurgitation question severity moderate  Labile INR  Sinus bradycardia  Atrial lead failure with over sensing.  Orthostatic lightheadedness    Recurrent increasing burden of atrial fibrillation now about 3-4% based on histogram data.  We will continue her sotalol at 80 twice daily for now.  Treatment options could include the addition of ranolazine, the transitioning from sotalol to dofetilide, low-modest yield catheter ablation perhaps with Dr Noralee Stain.  She is not a candidate for amiodarone because of its impact on DFTs.  Another adjunctive benefit of changing sotalol--dofetilide would be perhaps some improvement in her chronotropic incompetence  No bleeding on her warfarin.  She has run out of her diuretics.  She continues to struggle with some edema.  We will have her change her furosemide from 40/20--60 every morning

## 2019-11-12 NOTE — Patient Instructions (Signed)
Medication Instructions:  Your physician recommends that you continue on your current medications as directed. Please refer to the Current Medication list given to you today.  Labwork: None ordered.  Testing/Procedures: None ordered.  Follow-Up: Your physician wants you to follow-up in: 3 months.   Remote monitoring is used to monitor your Pacemaker of ICD from home. This monitoring reduces the number of office visits required to check your device to one time per year. It allows [place] to keep an eye on the functioning of your device to ensure it is working properly.   Any Other Special Instructions Will Be Listed Below (If Applicable).  If you need a refill on your cardiac medications before your next appointment, please call your pharmacy.

## 2019-12-07 ENCOUNTER — Telehealth: Payer: Self-pay | Admitting: Internal Medicine

## 2019-12-07 NOTE — Telephone Encounter (Signed)
° ° °  Patient c/o Palpitations:  High priority if patient c/o lightheadedness, shortness of breath, or chest pain  1) How long have you had palpitations/irregular HR/ Afib? Are you having the symptoms now? Yes  2) Are you currently experiencing lightheadedness, SOB or CP? Denied symptoms   3) Do you have a history of afib (atrial fibrillation) or irregular heart rhythm? Yes  4) Have you checked your BP or HR? (document readings if available): HR 90  5) Are you experiencing any other symptoms? Pt said she might be on Afib, her HR is all over the place. She said he HR goes 90-100 pretty quick and she can feel her heart beating fast.

## 2019-12-11 NOTE — Telephone Encounter (Signed)
We did get a transmission on Friday, however no alerts triggered and I don't know how much it helps since she is programmed VVI (essentially not using the atrial lead).  She was in AF at the time of the transmission.

## 2019-12-11 NOTE — Telephone Encounter (Signed)
Attempted phone call to pt.  Left voicemail message to contact RN at [phone]. 

## 2019-12-12 NOTE — Telephone Encounter (Signed)
Spoke with pt and who reports she went out of Afib on Friday afternoon and is feeling much better now.  Pt states taking the extra Furosemide also helped. She is taking medications as prescribed. Pt is due for follow up in September 2021.  Pt denies other symptoms at this time.  Pt states she will notify office if she develops symptoms again.  Pt advised if she develops active CP, severe SOB or dizziness to go directly to ED for further evaluation.  Pt verbalizes understanding and agrees with current plan.

## 2019-12-12 NOTE — Telephone Encounter (Signed)
Patient is returning call.  °

## 2019-12-19 ENCOUNTER — Other Ambulatory Visit: Payer: Self-pay

## 2019-12-19 MED ORDER — POTASSIUM CHLORIDE ER 10 MEQ PO TBCR: 10.0000 meq | EXTENDED_RELEASE_TABLET | Freq: Every day | ORAL | 0 refills | Status: DC

## 2020-01-14 ENCOUNTER — Telehealth: Payer: Self-pay | Admitting: Internal Medicine

## 2020-01-14 NOTE — Telephone Encounter (Signed)
Manual transmission received, pt is in AF with currently controlled VRates.   Device programmed VVI due to Atrial lead issues.   Pt confirmed compliance with current meds including Sotalol 80mg  BID.  Pt is on Coumadin for OAC.    Pt seen in AF clinic previously.  Given she is symptomatic will send message for f/u.

## 2020-01-14 NOTE — Telephone Encounter (Signed)
Called and left message for patient to call [facility], need to schedule appt with Jorja Loa, PA.

## 2020-01-14 NOTE — Telephone Encounter (Signed)
Patient c/o Palpitations:  High priority if patient c/o lightheadedness, shortness of breath, or chest pain  1) How long have you had palpitations/irregular HR/ Afib?   A-Fib since Wednesday.     Are you having the symptoms now? Having it now, states it comes and goes, she went to the ER yesterday.   2) Are you currently experiencing lightheadedness, SOB or CP? SOB when walking, states she feels really tired.   3) Do you have a history of afib (atrial fibrillation) or irregular heart rhythm? yes  4) Have you checked your BP or HR? (document readings if available): HR 107  5) Are you experiencing any other symptoms? No  She did sent a Mychart message as well about this.

## 2020-01-15 ENCOUNTER — Ambulatory Visit (HOSPITAL_COMMUNITY)
Admission: RE | Admit: 2020-01-15 | Discharge: 2020-01-15 | Disposition: A | Discharge status: Home / Self Care | Payer: Medicare Other | Source: Ambulatory Visit | Attending: Physician Assistant | Admitting: Physician Assistant

## 2020-01-15 ENCOUNTER — Encounter (HOSPITAL_COMMUNITY): Payer: Self-pay | Admitting: Physician Assistant

## 2020-01-15 ENCOUNTER — Other Ambulatory Visit (HOSPITAL_COMMUNITY): Payer: Self-pay | Admitting: *Deleted

## 2020-01-15 ENCOUNTER — Other Ambulatory Visit: Payer: Self-pay

## 2020-01-15 VITALS — BP 132/82 | HR 99 | Ht 62.0 in | Wt 149.4 lb

## 2020-01-15 DIAGNOSIS — Z79899 Other long term (current) drug therapy: Secondary | ICD-10-CM | POA: Diagnosis not present

## 2020-01-15 DIAGNOSIS — R0600 Dyspnea, unspecified: Secondary | ICD-10-CM | POA: Insufficient documentation

## 2020-01-15 DIAGNOSIS — I422 Other hypertrophic cardiomyopathy: Secondary | ICD-10-CM | POA: Diagnosis not present

## 2020-01-15 DIAGNOSIS — R0602 Shortness of breath: Secondary | ICD-10-CM | POA: Insufficient documentation

## 2020-01-15 DIAGNOSIS — Z8249 Family history of ischemic heart disease and other diseases of the circulatory system: Secondary | ICD-10-CM | POA: Diagnosis not present

## 2020-01-15 DIAGNOSIS — Z7901 Long term (current) use of anticoagulants: Secondary | ICD-10-CM | POA: Diagnosis not present

## 2020-01-15 DIAGNOSIS — R5383 Other fatigue: Secondary | ICD-10-CM | POA: Diagnosis not present

## 2020-01-15 DIAGNOSIS — K219 Gastro-esophageal reflux disease without esophagitis: Secondary | ICD-10-CM | POA: Diagnosis not present

## 2020-01-15 DIAGNOSIS — R002 Palpitations: Secondary | ICD-10-CM | POA: Insufficient documentation

## 2020-01-15 DIAGNOSIS — Z20822 Contact with and (suspected) exposure to covid-19: Secondary | ICD-10-CM | POA: Insufficient documentation

## 2020-01-15 DIAGNOSIS — I4819 Other persistent atrial fibrillation: Secondary | ICD-10-CM

## 2020-01-15 DIAGNOSIS — I421 Obstructive hypertrophic cardiomyopathy: Secondary | ICD-10-CM | POA: Insufficient documentation

## 2020-01-15 DIAGNOSIS — G4733 Obstructive sleep apnea (adult) (pediatric): Secondary | ICD-10-CM | POA: Diagnosis not present

## 2020-01-15 DIAGNOSIS — I5022 Chronic systolic (congestive) heart failure: Secondary | ICD-10-CM | POA: Diagnosis not present

## 2020-01-15 DIAGNOSIS — D6869 Other thrombophilia: Secondary | ICD-10-CM

## 2020-01-15 DIAGNOSIS — Z885 Allergy status to narcotic agent status: Secondary | ICD-10-CM | POA: Insufficient documentation

## 2020-01-15 DIAGNOSIS — I429 Cardiomyopathy, unspecified: Secondary | ICD-10-CM | POA: Insufficient documentation

## 2020-01-15 DIAGNOSIS — I340 Nonrheumatic mitral (valve) insufficiency: Secondary | ICD-10-CM | POA: Diagnosis not present

## 2020-01-15 DIAGNOSIS — E785 Hyperlipidemia, unspecified: Secondary | ICD-10-CM | POA: Diagnosis not present

## 2020-01-15 LAB — CBC
HCT: 41.9 % (ref 36.0–46.0)
Hemoglobin: 13.5 g/dL (ref 12.0–15.0)
MCH: 32.3 pg (ref 26.0–34.0)
MCHC: 32.2 g/dL (ref 30.0–36.0)
MCV: 100.2 fL — ABNORMAL HIGH (ref 80.0–100.0)
Platelets: 210 10*3/uL (ref 150–400)
RBC: 4.18 MIL/uL (ref 3.87–5.11)
RDW: 13 % (ref 11.5–15.5)
WBC: 5.5 10*3/uL (ref 4.0–10.5)
nRBC: 0 % (ref 0.0–0.2)

## 2020-01-15 LAB — BASIC METABOLIC PANEL
Anion gap: 13 (ref 5–15)
BUN: 18 mg/dL (ref 8–23)
CO2: 24 mmol/L (ref 22–32)
Calcium: 9.2 mg/dL (ref 8.9–10.3)
Chloride: 104 mmol/L (ref 98–111)
Creatinine, Ser: 0.83 mg/dL (ref 0.44–1.00)
GFR calc Af Amer: >60 mL/min (ref >60)
GFR calc non Af Amer: >60 mL/min (ref >60)
Glucose, Bld: 146 mg/dL — ABNORMAL HIGH (ref 70–99)
Potassium: 3.4 mmol/L — ABNORMAL LOW (ref 3.5–5.1)
Sodium: 141 mmol/L (ref 135–145)

## 2020-01-15 LAB — MAGNESIUM: Magnesium: 1.9 mg/dL (ref 1.7–2.4)

## 2020-01-15 MED ORDER — POTASSIUM CHLORIDE ER 10 MEQ PO TBCR
20.0000 meq | EXTENDED_RELEASE_TABLET | Freq: Every day | ORAL | 0 refills | Status: DC
Start: 2020-01-15 — End: 2020-08-20

## 2020-01-15 NOTE — Telephone Encounter (Signed)
Pt is scheduled with AFIB clinic 01/15/2020, 2pm with Clint Fenton.

## 2020-01-15 NOTE — Progress Notes (Signed)
Primary Care Physician: Nelda Bucks, MD Primary Electrophysiologist: Dr Graciela Husbands Referring Physician: Dr Gillermina [facility]   Aimee Edwards is a 74 y.o. female with a history of hypertrophic cardiomyopathy status post septal myectomy, ICD implantation, OSA. and paroxysmal atrial fibrillation who presents for follow up in the [facility]. Patient reports that on the night of 04/24/19 she fell asleep and missed a dose of her metoprolol. She began having elevated heart rates between 100-120s associated with SOB and fatigue.  She sent in a remote device transmission which did show afib with brief episodes of RVR. She remains in afib today. She does have a diagnosis of OSA and has not been on CPAP for 5 years. There were no other specific triggers that the patient could identify. She is on coumadin for a CHADS2VASC score of 3. Patient is s/p TEE/DCCV on 05/02/19. Patient reports that later that evening she presented to [facility] with SOB and was found to be fluid overloaded. She was hospitalized for 3 days and was diuresed.   Patient presented to the ED at the [facility] on 08/22/19. She was found to be in afib with RVR and a BNP of 2800. She was diuresed. Her sotalol was held initially with low BP but this was resumed prior to discharge. She converted back to SR 08/24/19 without DCCV. She did have an echo which showed reduced EF 25-30%. Of note, she was being treated for shingles at the time of her afib episode.   On follow up today, patient started having symptoms of fatigue, dyspnea with exertion, and palpitations on 01/09/20. Her smart watch shows afib. Overall, she reports that her heart rates have ranged from 60s-low 100s. There were no specific triggers that she could identify. She has a h/o OSA and has not been on CPAP for years.   Today, she denies symptoms of chest pain, orthopnea, PND, lower extremity edema, dizziness, presyncope, syncope, bleeding, or  neurologic sequela. The patient is tolerating medications without difficulties and is otherwise without complaint today.    Atrial Fibrillation Risk Factors:  she does have symptoms or diagnosis of sleep apnea. she is not compliant with CPAP therapy. she does not have a history of rheumatic fever. she does not have a history of alcohol use. The patient does have a history of early familial atrial fibrillation or other arrhythmias.  she has a BMI of Body mass index is 27.33 kg/m.Marland Kitchen Filed Weights   01/15/20 1409  Weight: 67.8 kg    Family History  Problem Relation Age of Onset   Alzheimer's disease Mother    Emphysema Father    Diabetes Father    Heart disease Brother    Cancer Brother    Diabetes Paternal Grandmother    Cardiomyopathy Other        several in family have had this      Atrial Fibrillation Management history:  Previous antiarrhythmic drugs: amiodarone, sotalol Previous cardioversions: 05/02/19 Previous ablations: none CHADS2VASC score: 3 Anticoagulation history: warfarin   Past Medical History:  Diagnosis Date   Anxiety    Atrial fibrillation (HCC)    Back pain    Cardiomyopathy, hypertrophic obstructive (HCC)    Complication of anesthesia    Degeneration of cervical intervertebral disc    Dyslipidemia    Edema    Gastroesophageal reflux disease    Hernia, abdominal    Hypokalemia    ICD (implantable cardioverter-defibrillator) in place    Mitral regurgitation  Pinched nerve    PONV (postoperative nausea and vomiting)    Presence of permanent cardiac pacemaker    Reflux    Right shoulder pain    Shortness of breath on exertion    Sleep apnea    Past Surgical History:  Procedure Laterality Date   CARDIAC CATHETERIZATION     CARDIOVERSION N/A 05/02/2019   Procedure: CARDIOVERSION;  Surgeon: Thurmon Fairroitoru, Mihai, MD;  Location: [facility] ENDOSCOPY;  Service: Cardiovascular;  Laterality: N/A;   COLONSCOPY     CORONARY  ARTERY BYPASS GRAFT     pt denies this. She said she never had bypass surgery.   ICD GENERATOR CHANGE     ICD/BIV ICD FUNCTION(DFT) TEST N/A 06/26/2018   Procedure: ICD/BIV ICD FUNCTION (DFT) TEST;  Surgeon: Marinus Mawaylor, Gregg W, MD;  Location: [facility] INVASIVE CV LAB;  Service: Cardiovascular;  Laterality: N/A;   MYOMECTOMY     OTHER HEART/PERICARD OPS     PACEMAKER INSERTION     TEE WITHOUT CARDIOVERSION N/A 05/02/2019   Procedure: TRANSESOPHAGEAL ECHOCARDIOGRAM (TEE);  Surgeon: Thurmon Fairroitoru, Mihai, MD;  Location: [facility] ENDOSCOPY;  Service: Cardiovascular;  Laterality: N/A;   TONSILLECTOMY     VAGINAL HYSTERECTOMY      Current Outpatient Medications  Medication Sig Dispense Refill   atorvastatin (LIPITOR) 40 MG tablet TAKE ONE TABLET BY MOUTH DAILY AT 6 PM 90 tablet 3   Carboxymethylcellulose Sodium (ARTIFICIAL TEARS OP) Place 1 drop into both eyes daily as needed (dry eyes).     furosemide (LASIX) 20 MG tablet Take 1 tablet (20 mg total) by mouth daily. 90 tablet 3   furosemide (LASIX) 40 MG tablet Take 1 tablet (40 mg total) by mouth daily. 90 tablet 3   magnesium oxide (MAG-OX) 400 MG tablet Take 1 tablet (400 mg total) by mouth daily. 90 tablet 3   meclizine (ANTIVERT) 25 MG tablet Take 25 mg by mouth 3 (three) times daily as needed for dizziness.      omeprazole (PRILOSEC) 20 MG capsule Take 20 mg by mouth daily.     potassium chloride (KLOR-CON) 10 MEQ tablet Take 1 tablet (10 mEq total) by mouth daily. 90 tablet 0   sotalol (BETAPACE) 80 MG tablet Take 1 tablet (80 mg total) by mouth 2 (two) times daily. 180 tablet 3   spironolactone (ALDACTONE) 25 MG tablet TAKE ONE-HALF TABLET BY MOUTH DAILY 45 tablet 1   warfarin (COUMADIN) 3 MG tablet Take 3 mg by mouth every evening.     No current facility-administered medications for this encounter.    Allergies  Allergen Reactions   Codeine Nausea And Vomiting   Lortab [Hydrocodone-Acetaminophen] Nausea And Vomiting    Social  History   Socioeconomic History   Marital status: Divorced    Spouse name: Not on file   Number of children: 3   Years of education: Not on file   Highest education level: Some college, no degree  Occupational History   Not on file  Tobacco Use   Smoking status: Never Smoker   Smokeless tobacco: Never Used  Vaping Use   Vaping Use: Never used  Substance and Sexual Activity   Alcohol use: Not Currently    Comment: "once in awhile"   Drug use: Never   Sexual activity: Not Currently  Other Topics Concern   Not on file  Social History Narrative   Lives at home alone    Right handed   Caffeine: quit Jan 2020   Social Determinants of Dispensing opticianHealth   Financial  Resource Strain:    Difficulty of Paying Living Expenses: Not on file  Food Insecurity:    Worried About Running Out of Food in the Last Year: Not on file   Ran Out of Food in the Last Year: Not on file  Transportation Needs:    Lack of Transportation (Medical): Not on file   Lack of Transportation (Non-Medical): Not on file  Physical Activity:    Days of Exercise per Week: Not on file   Minutes of Exercise per Session: Not on file  Stress:    Feeling of Stress : Not on file  Social Connections:    Frequency of Communication with Friends and Family: Not on file   Frequency of Social Gatherings with Friends and Family: Not on file   Attends Religious Services: Not on file   Active Member of Clubs or Organizations: Not on file   Attends Banker Meetings: Not on file   Marital Status: Not on file  Intimate Partner Violence:    Fear of Current or Ex-Partner: Not on file   Emotionally Abused: Not on file   Physically Abused: Not on file   Sexually Abused: Not on file     ROS- All systems are reviewed and negative except as per the HPI above.  Physical Exam: Vitals:   01/15/20 1409  BP: 132/82  Pulse: 99  Weight: 67.8 kg  Height: 5\' 2"  (1.575 m)    GEN- The patient is  well appearing elderly female, alert and oriented x 3 today.   HEENT-head normocephalic, atraumatic, sclera clear, conjunctiva pink, hearing intact, trachea midline. Lungs- Clear to ausculation bilaterally, normal work of breathing Heart- irregular rate and rhythm, no murmurs, rubs or gallops  GI- soft, NT, ND, + BS Extremities- no clubbing, cyanosis, or edema MS- no significant deformity or atrophy Skin- no rash or lesion Psych- euthymic mood, full affect Neuro- strength and sensation are intact   Wt Readings from Last 3 Encounters:  01/15/20 67.8 kg  11/12/19 68 kg  08/27/19 66.7 kg    EKG today demonstrates afib HR 99, LVH, QRS 148, QTc 487  Echo 06/23/18 demonstrated  1. The left ventricle has LVEF is approximately 45% with hypokinesis/akinesis of the inferior, inferoseptal walls. The cavity size is mild to moderately increased. There is no left ventricular wall thickness. Echo evidence of impaired relaxation  diastolic filling patterns.  2. Moderately dilated left atrial size.  3. Normal right atrial size.  4. The mitral valve normal in structure. Regurgitation is mild by color flow Doppler.  5. Normal tricuspid valve.  6. Tricuspid regurgitation is mild.  7. The aortic valve tricuspid. There is mild thickening and mild calcification of the aortic valve. Aortic valve regurgitation is mild by color flow Doppler.  8. No atrial level shunt detected by color flow Doppler.  Echo 08/23/19 [facility])  1. Overall left ventricular ejection fraction is estimated at 25 to 30%.  2. Severely decreased global left ventricular systolic function.  3. Left ventricular wall motion abnormalities exist. See wall motion findings.  4. Moderately increased left ventricular internal cavity size.  5. Moderately dilated left atrium.  6. Mildly dilated right atrium.  7. Moderate mitral valve regurgitation.  8. Mild-moderate tricuspid regurgitation.  9. Mild to moderate aortic  regurgitation. 10. Mildly elevated pulmonary artery systolic pressure.  Epic records are reviewed at length today  Assessment and Plan:  1. Persistent atrial fibrillation Patient back in afib since 01/09/20. We discussed therapeutic options today. Recall  she failed amiodarone previously 2/2 increased DFT with failed defibrillation. We discussed dofetilide admission and she would like to avoid this if at all possible with the current COVID-19 hospitalization rate. She is not opposed to ablation although her severely dilated LA is a disincentive for ablation.  Will arrange for TEE/DCCV as she is on warfarin and does not feel she can wait for 4 weekly INRs. Patient to check INR today, will have her forward result via MyChart.  Continue sotalol 80 mg BID for now. Check bmet/mag/CBC  This patients CHA2DS2-VASc Score and unadjusted Ischemic Stroke Rate (% per year) is equal to 3.2 % stroke rate/year from a score of 3  Above score calculated as 1 point each if present [CHF, HTN, DM, Vascular=MI/PAD/Aortic Plaque, Age if 65-74, or Female] Above score calculated as 2 points each if present [Age > 75, or Stroke/TIA/TE]  2. H/o OSA/snoring/witnessed apnea The importance of adequate treatment of sleep apnea was discussed today in order to improve our ability to maintain sinus rhythm long term. Patient would like referral for to sleep medicine to resume CPAP.  3. Hypertrophic obstructive cardiomyopathy S/p myectomy 1996.  4. Systolic CHF EF on recent echo 25-30%.  No signs or symptoms of fluid overload today.    Follow up with Dr Graciela Husbands as scheduled post DCCV.    Jorja Loa PA-C Afib Clinic [address] [facility] [address] [place], [place] [address] [phone] 01/15/2020 3:11 PM

## 2020-01-15 NOTE — Patient Instructions (Signed)
Cardioversion scheduled for Friday, August 27th  - Arrive at the [facility] and go to admitting at [facility]  -Check INR prior to arrival and bring confirmation with you.  - Do not eat or drink anything after midnight the night prior to your procedure.  - Take all your morning medication (except diabetic medications) with a sip of water prior to arrival.  - You will not be able to drive home after your procedure.  - Do NOT miss any doses of your blood thinner - if you should miss a dose please notify our office immediately.  COVID testing will need to be performed tomorrow at [address] [place] (go to main entrance check in at front desk)

## 2020-01-15 NOTE — Telephone Encounter (Signed)
Patient returned my call.  She is agreeable to appt today at 2 pm with Jorja Loa, PA.

## 2020-01-16 ENCOUNTER — Other Ambulatory Visit (HOSPITAL_COMMUNITY)
Admission: RE | Admit: 2020-01-16 | Discharge: 2020-01-16 | Disposition: A | Discharge status: Home / Self Care | Payer: Medicare Other | Source: Ambulatory Visit | Attending: Cardiovascular Disease | Admitting: Cardiovascular Disease

## 2020-01-16 DIAGNOSIS — Z20822 Contact with and (suspected) exposure to covid-19: Secondary | ICD-10-CM | POA: Diagnosis not present

## 2020-01-16 DIAGNOSIS — Z01812 Encounter for preprocedural laboratory examination: Secondary | ICD-10-CM | POA: Insufficient documentation

## 2020-01-17 LAB — SARS CORONAVIRUS 2 (TAT 6-24 HRS): SARS Coronavirus 2: NEGATIVE

## 2020-01-18 ENCOUNTER — Ambulatory Visit (HOSPITAL_COMMUNITY)
Admission: RE | Admit: 2020-01-18 | Discharge: 2020-01-18 | Disposition: A | Discharge status: Home / Self Care | Payer: Medicare Other | Source: Ambulatory Visit | Attending: Cardiovascular Disease | Admitting: Cardiovascular Disease

## 2020-01-18 ENCOUNTER — Encounter (HOSPITAL_COMMUNITY)
Admission: RE | Disposition: A | Discharge status: Home / Self Care | Payer: Medicare Other | Source: Ambulatory Visit | Attending: Cardiovascular Disease

## 2020-01-18 ENCOUNTER — Encounter (HOSPITAL_COMMUNITY): Payer: Self-pay | Admitting: Certified Registered"

## 2020-01-18 DIAGNOSIS — Z538 Procedure and treatment not carried out for other reasons: Secondary | ICD-10-CM | POA: Insufficient documentation

## 2020-01-18 DIAGNOSIS — I4819 Other persistent atrial fibrillation: Secondary | ICD-10-CM | POA: Diagnosis present

## 2020-01-18 DIAGNOSIS — I480 Paroxysmal atrial fibrillation: Secondary | ICD-10-CM

## 2020-01-18 SURGERY — CANCELLED PROCEDURE

## 2020-01-18 NOTE — Progress Notes (Signed)
    Aimee Edwards was found to be in sinus rhythm today . Cardioversion was cancelled.  She has an appt with Dr. Graciela Husbands in ~ 2 weeks.   Sept 13, 2021    Aimee Miss, MD  01/18/2020 10:11 AM    [facility] [address] [place],  [address] [place], [place]  [address] Phone: [phone]; Fax: [phone]

## 2020-01-29 ENCOUNTER — Ambulatory Visit (INDEPENDENT_AMBULATORY_CARE_PROVIDER_SITE_OTHER): Payer: Medicare Other | Admitting: *Deleted

## 2020-01-29 DIAGNOSIS — I422 Other hypertrophic cardiomyopathy: Secondary | ICD-10-CM

## 2020-01-29 LAB — CUP PACEART REMOTE DEVICE CHECK
Battery Remaining Longevity: 18 mo
Battery Remaining Percentage: 24 %
Brady Statistic RA Percent Paced: 0 %
Brady Statistic RV Percent Paced: 1 %
Date Time Interrogation Session: 20210907010500
HighPow Impedance: 59 Ohm
Implantable Lead Implant Date: 19970916
Implantable Lead Implant Date: 19970916
Implantable Lead Location: 753859
Implantable Lead Location: 753860
Implantable Lead Model: 125
Implantable Lead Model: 4269
Implantable Lead Serial Number: 222062
Implantable Lead Serial Number: 279972
Implantable Pulse Generator Implant Date: 20101020
Lead Channel Impedance Value: 452 Ohm
Lead Channel Pacing Threshold Amplitude: 1.1 V
Lead Channel Pacing Threshold Amplitude: 4.5 V
Lead Channel Pacing Threshold Pulse Width: 0.4 ms
Lead Channel Pacing Threshold Pulse Width: 1 ms
Lead Channel Setting Pacing Amplitude: 2.6 V
Lead Channel Setting Pacing Pulse Width: 0.4 ms
Lead Channel Setting Sensing Sensitivity: 0.6 mV
Pulse Gen Serial Number: 149386

## 2020-01-30 NOTE — Progress Notes (Signed)
Remote ICD transmission.   

## 2020-02-04 ENCOUNTER — Other Ambulatory Visit: Payer: Self-pay

## 2020-02-04 ENCOUNTER — Ambulatory Visit (INDEPENDENT_AMBULATORY_CARE_PROVIDER_SITE_OTHER): Payer: Medicare Other | Admitting: Internal Medicine

## 2020-02-04 ENCOUNTER — Encounter: Payer: Self-pay | Admitting: Internal Medicine

## 2020-02-04 VITALS — BP 112/64 | HR 50 | Ht 62.0 in | Wt 147.0 lb

## 2020-02-04 DIAGNOSIS — I472 Ventricular tachycardia, unspecified: Secondary | ICD-10-CM

## 2020-02-04 DIAGNOSIS — Z95810 Presence of automatic (implantable) cardiac defibrillator: Secondary | ICD-10-CM

## 2020-02-04 DIAGNOSIS — I421 Obstructive hypertrophic cardiomyopathy: Secondary | ICD-10-CM | POA: Diagnosis not present

## 2020-02-04 DIAGNOSIS — I480 Paroxysmal atrial fibrillation: Secondary | ICD-10-CM | POA: Diagnosis not present

## 2020-02-04 DIAGNOSIS — Z79899 Other long term (current) drug therapy: Secondary | ICD-10-CM

## 2020-02-04 NOTE — Progress Notes (Signed)
Patient Care Team: Nelda Bucks, MD as PCP - General (Family Medicine) Duke Salvia, MD as PCP - Cardiology (Cardiology)   HPI  Aimee Edwards is a 74 y.o. female Seen in follow-up for hypertrophic cardiomyopathy status post septal myectomy (1996) but she had nonsustained VT 1/2 years (VVI 40. She has a history of ICD implantation. She has had interval atrial fibrillation resulting in inappropriate ICD discharges.  Anticoagulation with warfarin  She has had problems with atrial lead with over sensing the lead has been inactivated    DATE TEST EF   4/18 Echo   45-50 % Severe>>See Below --Moderate  11/19 Echo  60-65% Severe LAE  1/20 Echo  40.45%   1/20 LHC   Cors normal LVEDP 23  12/20 TEE 45-50% LAE Severe  4/21 Echo  25-30%  MR-moderate AI mild-moderate   Date Cr K Hgb  8/19 0.75 4.3 12.3  06/22/18 1.09 3.6    12/20 0.7 3.6 14.6  8/21 0.83 3.4 13.5   She has a history of mitral regurgitation with comments suggesting severe MR and moderate AI.  Have reviewed with PR >> mod-central  Medical management recommended   Hospitalized [place] 2/2 ICD discharges for VT and started on amiodarone. When seen following discharge, it was noted that she had failed to convert with maximum output.  She was hospitalized and started on sotalol 80 mg twice daily.  She underwent DFT testing with defibrillation threshold less than 21 J.  She has been maintained on low-dose sotalol now at 80 twice daily  12/20 underwent TEE guided cardioversion  Seen in A. fib clinic 4/21 following an ED visit with [facility] 3/21 where she was found to be in A. fib with a rapid rate and converted spontaneously.  Latitude transmission 11/07/2019 demonstrated atrial fibrillation  Seen in A. fib clinic 8/21 found to be in A. fib quite symptomatic and scheduled for TEE guided cardioversion-converted spontaneously   Some fatigue.  No edema.  No chest pain.  Sometimes her fatigue relates to atrial  fibrillation other times it does not.  No significant bleeding but her most recent INR was 7.7.  There is an opportunity to get support for apixaban.  Last potassium was low         Past Medical History:  Diagnosis Date  . Anxiety   . Atrial fibrillation (HCC)   . Back pain   . Cardiomyopathy, hypertrophic obstructive (HCC)   . Complication of anesthesia   . Degeneration of cervical intervertebral disc   . Dyslipidemia   . Edema   . Gastroesophageal reflux disease   . Hernia, abdominal   . Hypokalemia   . ICD (implantable cardioverter-defibrillator) in place   . Mitral regurgitation   . Pinched nerve   . PONV (postoperative nausea and vomiting)   . Presence of permanent cardiac pacemaker   . Reflux   . Right shoulder pain   . Shortness of breath on exertion   . Sleep apnea     Past Surgical History:  Procedure Laterality Date  . CARDIAC CATHETERIZATION    . CARDIOVERSION N/A 05/02/2019   Procedure: CARDIOVERSION;  Surgeon: Thurmon Fair, MD;  Location: [facility] ENDOSCOPY;  Service: Cardiovascular;  Laterality: N/A;  . COLONSCOPY    . CORONARY ARTERY BYPASS GRAFT     pt denies this. She said she never had bypass surgery.  . ICD GENERATOR CHANGE    . ICD/BIV ICD FUNCTION(DFT) TEST N/A 06/26/2018   Procedure: ICD/BIV  ICD FUNCTION (DFT) TEST;  Surgeon: Marinus Maw, MD;  Location: [facility] INVASIVE CV LAB;  Service: Cardiovascular;  Laterality: N/A;  . MYOMECTOMY    . OTHER HEART/PERICARD OPS    . PACEMAKER INSERTION    . TEE WITHOUT CARDIOVERSION N/A 05/02/2019   Procedure: TRANSESOPHAGEAL ECHOCARDIOGRAM (TEE);  Surgeon: Thurmon Fair, MD;  Location: [facility] ENDOSCOPY;  Service: Cardiovascular;  Laterality: N/A;  . TONSILLECTOMY    . VAGINAL HYSTERECTOMY      Current Outpatient Medications  Medication Sig Dispense Refill  . atorvastatin (LIPITOR) 40 MG tablet TAKE ONE TABLET BY MOUTH DAILY AT 6 PM 90 tablet 3  . Carboxymethylcellulose Sodium (ARTIFICIAL TEARS OP) Place 1 drop  into both eyes daily as needed (dry eyes).    . furosemide (LASIX) 20 MG tablet Take 1 tablet (20 mg total) by mouth daily. 90 tablet 3  . furosemide (LASIX) 40 MG tablet Take 1 tablet (40 mg total) by mouth daily. 90 tablet 3  . magnesium oxide (MAG-OX) 400 MG tablet Take 1 tablet (400 mg total) by mouth daily. 90 tablet 3  . meclizine (ANTIVERT) 25 MG tablet Take 25 mg by mouth 3 (three) times daily as needed for dizziness.     Marland Kitchen omeprazole (PRILOSEC) 20 MG capsule Take 20 mg by mouth daily.    . potassium chloride (KLOR-CON) 10 MEQ tablet Take 2 tablets (20 mEq total) by mouth daily. 90 tablet 0  . sotalol (BETAPACE) 80 MG tablet Take 1 tablet (80 mg total) by mouth 2 (two) times daily. 180 tablet 3  . spironolactone (ALDACTONE) 25 MG tablet TAKE ONE-HALF TABLET BY MOUTH DAILY 45 tablet 1  . warfarin (COUMADIN) 3 MG tablet Take 3 mg by mouth every evening.     No current facility-administered medications for this visit.    Allergies  Allergen Reactions  . Codeine Nausea And Vomiting  . Lortab [Hydrocodone-Acetaminophen] Nausea And Vomiting     Review of Systems negative except from HPI and PMH  Physical Exam   BP 112/64   Pulse (!) 50   Ht 5\' 2"  (1.575 m)   Wt 147 lb (66.7 kg)   SpO2 94%   BMI 26.89 kg/m   Well developed and well nourished in no acute distress HENT normal Neck supple with JVP-flat Clear Device pocket well healed; without hematoma or erythema.  There is no tethering  Regular rate and rhythm, no   murmur Abd-soft with active BS No Clubbing cyanosis   edema Skin-warm and dry A & Oriented  Grossly normal sensory and motor function   ECG sinus at 50 Intervals 18/15/48 Occasional PAC*        Assessment and  Plan Ventricular Tachycardia  Elevated defibrillation thresholds prompting the introduction of sotalol with adequate DFTs thereafter and discontinuation of amiodarone  Hypertrophic obstructive cardiomyopathy status post septal myectomy  1996  Implantable defibrillator 1996? Indication      Atrial fibrillation-paroxysmal-recurrent with inappropriate ICD shocks  Congestive heart failure- -class IIb  Mitral regurgitation/aortic regurgitation question severity moderate  Labile INR  Sinus bradycardia  Atrial lead failure with over sensing.  Orthostatic lightheadedness   Increasing burden of atrial fibrillation.  A. fib clinic is arranged for her to have a sleep study which is appropriate.  I agree that the likelihood of catheter ablation is successful is quite low given her atriopathy.  We will plan to increase sotalol transition to dofetilide if the atrial fibrillation burden dictates.  Right now these would both be delayed because  of Covid.  Will reassess in a few months.  At that time, we will plan to replace her atrial lead both for sinus node dysfunction as well as for diagnostics.  Currently the lead is off because of fracture.  She has an opportunity to get support for Eliquis.  Her most recent INR was over 7.

## 2020-02-04 NOTE — Patient Instructions (Signed)
Medication Instructions:  Hand written prescription given for Eliquis 5mg  - 1 tablet by mouth twice daily.  #180 with 3 refills.  Do not take Eliquis and Warfarin together if you are able to have the Eliquis filled.  *If you need a refill on your cardiac medications before your next appointment, please call your pharmacy*   Lab Work: BMET and Mg today If you have labs (blood work) drawn today and your tests are completely normal, you will receive your results only by:  MyChart Message (if you have MyChart) OR  A paper copy in the mail If you have any lab test that is abnormal or we need to change your treatment, we will call you to review the results.   Testing/Procedures: None ordered.    Follow-Up: At [facility], you and your health needs are our priority.  As part of our continuing mission to provide you with exceptional heart care, we have created designated Provider Care Teams.  These Care Teams include your primary Cardiologist (physician) and Advanced Practice Providers (APPs -  Physician Assistants and Nurse Practitioners) who all work together to provide you with the care you need, when you need it.  We recommend signing up for the patient portal called "MyChart".  Sign up information is provided on this After Visit Summary.  MyChart is used to connect with patients for Virtual Visits (Telemedicine).  Patients are able to view lab/test results, encounter notes, upcoming appointments, etc.  Non-urgent messages can be sent to your provider as well.   To learn more about what you can do with MyChart, go to [facility].    Your next appointment:   6 month(s)  The format for your next appointment:   In Person  Provider:   [email or web address], MD

## 2020-02-05 ENCOUNTER — Other Ambulatory Visit (HOSPITAL_COMMUNITY): Payer: Self-pay | Admitting: *Deleted

## 2020-02-05 DIAGNOSIS — R0683 Snoring: Secondary | ICD-10-CM

## 2020-02-05 LAB — BASIC METABOLIC PANEL
BUN/Creatinine Ratio: 23 (ref 12–28)
BUN: 17 mg/dL (ref 8–27)
CO2: 27 mmol/L (ref 20–29)
Calcium: 9.4 mg/dL (ref 8.7–10.3)
Chloride: 101 mmol/L (ref 96–106)
Creatinine, Ser: 0.73 mg/dL (ref 0.57–1.00)
GFR calc Af Amer: 94 mL/min/{1.73_m2} (ref >59)
GFR calc non Af Amer: 81 mL/min/{1.73_m2} (ref >59)
Glucose: 82 mg/dL (ref 65–99)
Potassium: 3.8 mmol/L (ref 3.5–5.2)
Sodium: 144 mmol/L (ref 134–144)

## 2020-02-05 LAB — CUP PACEART INCLINIC DEVICE CHECK
Brady Statistic RV Percent Paced: 1 % — CL
Date Time Interrogation Session: 20210913000000
HighPow Impedance: 52 Ohm
HighPow Impedance: 63 Ohm
Implantable Lead Implant Date: 19970916
Implantable Lead Implant Date: 19970916
Implantable Lead Location: 753859
Implantable Lead Location: 753860
Implantable Lead Model: 125
Implantable Lead Model: 4269
Implantable Lead Serial Number: 222062
Implantable Lead Serial Number: 279972
Implantable Pulse Generator Implant Date: 20101020
Lead Channel Impedance Value: 392 Ohm
Lead Channel Impedance Value: 454 Ohm
Lead Channel Pacing Threshold Amplitude: 1.2 V
Lead Channel Pacing Threshold Amplitude: 4.5 V
Lead Channel Pacing Threshold Pulse Width: 0.4 ms
Lead Channel Pacing Threshold Pulse Width: 1 ms
Lead Channel Sensing Intrinsic Amplitude: 1.6 mV
Lead Channel Sensing Intrinsic Amplitude: 14.6 mV
Lead Channel Setting Pacing Amplitude: 2.6 V
Lead Channel Setting Pacing Pulse Width: 0.4 ms
Lead Channel Setting Sensing Sensitivity: 0.6 mV
Pulse Gen Serial Number: 149386

## 2020-02-05 LAB — MAGNESIUM: Magnesium: 2 mg/dL (ref 1.6–2.3)

## 2020-02-06 ENCOUNTER — Telehealth: Payer: Self-pay | Admitting: *Deleted

## 2020-02-06 NOTE — Telephone Encounter (Signed)
Staff message sent to Nina ok to schedule sleep study. Patient has Medicare and does not require getting a PA. 

## 2020-02-08 ENCOUNTER — Telehealth: Payer: Self-pay | Admitting: *Deleted

## 2020-02-08 NOTE — Telephone Encounter (Signed)
Patient is scheduled for lab study on 02/24/20. Patient understands her sleep study will be done at [facility] sleep lab. Patient understands she will receive a sleep packet in a week or so. Patient understands to call if she does not receive the sleep packet in a timely manner.  Left detailed message on voicemail with date and time of titration and informed patient to call back to confirm or reschedule.

## 2020-02-08 NOTE — Telephone Encounter (Signed)
-----   Message from Gaynelle Cage, CMA sent at 02/06/2020  7:49 AM EDT ----- Regarding: RE: sleep study Ok to schedule sleep study. No PA is required. Patient has Medicare. ----- Message ----- From: Shona Simpson, RN Sent: 02/05/2020   4:39 PM EDT To: [facility] Subject: sleep study                                    Pt has history of sleep apnea would like to get back on cpap but many years ago when sleep study done. Order placed for sleep study witnessed apnea, snoring  Thanks Kennyth Arnold

## 2020-02-23 ENCOUNTER — Other Ambulatory Visit: Payer: Self-pay | Admitting: Internal Medicine

## 2020-04-09 ENCOUNTER — Telehealth: Payer: Self-pay | Admitting: Internal Medicine

## 2020-04-09 ENCOUNTER — Telehealth: Payer: Self-pay

## 2020-04-09 NOTE — Telephone Encounter (Signed)
Called and spoke with patient, she is aware of appt 04/14/20 at 3 pm with Jorja Loa, PA.

## 2020-04-09 NOTE — Telephone Encounter (Signed)
Patient called in and wants to know if a nurse can look at her transmission to see if she is in Afib. Patient states that her watch is telling her that she is in Afib

## 2020-04-09 NOTE — Telephone Encounter (Signed)
I did not need this emcounter

## 2020-04-09 NOTE — Telephone Encounter (Signed)
Returning patients phone call.  Patient reports she thought she was back in AF. Reports of generalized fatigue, increased shortness of breath at times and palpitations. Patient has been compliant with medications. Advised patient I will forward to AF Clinic considering she has not been feeling well and Dr. Graciela Husbands is out of town. Patient agreeable to plan.

## 2020-04-14 ENCOUNTER — Ambulatory Visit (HOSPITAL_COMMUNITY)
Admission: RE | Admit: 2020-04-14 | Discharge: 2020-04-14 | Disposition: A | Discharge status: Home / Self Care | Payer: Medicare Other | Source: Ambulatory Visit | Attending: Physician Assistant | Admitting: Physician Assistant

## 2020-04-14 ENCOUNTER — Encounter (HOSPITAL_COMMUNITY): Payer: Self-pay | Admitting: Physician Assistant

## 2020-04-14 ENCOUNTER — Other Ambulatory Visit: Payer: Self-pay

## 2020-04-14 VITALS — BP 112/70 | HR 69 | Ht 62.0 in | Wt 150.0 lb

## 2020-04-14 DIAGNOSIS — Z951 Presence of aortocoronary bypass graft: Secondary | ICD-10-CM | POA: Diagnosis not present

## 2020-04-14 DIAGNOSIS — I4819 Other persistent atrial fibrillation: Secondary | ICD-10-CM | POA: Diagnosis not present

## 2020-04-14 DIAGNOSIS — G4733 Obstructive sleep apnea (adult) (pediatric): Secondary | ICD-10-CM | POA: Diagnosis not present

## 2020-04-14 DIAGNOSIS — I083 Combined rheumatic disorders of mitral, aortic and tricuspid valves: Secondary | ICD-10-CM | POA: Insufficient documentation

## 2020-04-14 DIAGNOSIS — I421 Obstructive hypertrophic cardiomyopathy: Secondary | ICD-10-CM | POA: Diagnosis not present

## 2020-04-14 DIAGNOSIS — Z7901 Long term (current) use of anticoagulants: Secondary | ICD-10-CM | POA: Diagnosis not present

## 2020-04-14 DIAGNOSIS — D6869 Other thrombophilia: Secondary | ICD-10-CM | POA: Diagnosis not present

## 2020-04-14 DIAGNOSIS — I5022 Chronic systolic (congestive) heart failure: Secondary | ICD-10-CM | POA: Diagnosis not present

## 2020-04-14 DIAGNOSIS — Z950 Presence of cardiac pacemaker: Secondary | ICD-10-CM | POA: Insufficient documentation

## 2020-04-14 NOTE — Progress Notes (Signed)
Primary Care Physician: Nelda Bucks, MD Primary Electrophysiologist: Dr Graciela Husbands Referring Physician: Dr Gillermina [facility]   Aimee Edwards is a 74 y.o. female with a history of hypertrophic cardiomyopathy status post septal myectomy, ICD implantation, OSA. and paroxysmal atrial fibrillation who presents for follow up in the [facility]. Patient reports that on the night of 04/24/19 she fell asleep and missed a dose of her metoprolol. She began having elevated heart rates between 100-120s associated with SOB and fatigue.  She sent in a remote device transmission which did show afib with brief episodes of RVR. She remains in afib today. She does have a diagnosis of OSA and has not been on CPAP for 5 years. There were no other specific triggers that the patient could identify. She is on coumadin for a CHADS2VASC score of 3. Patient is s/p TEE/DCCV on 05/02/19. Patient reports that later that evening she presented to [facility] with SOB and was found to be fluid overloaded. She was hospitalized for 3 days and was diuresed.   Patient presented to the ED at the [facility] on 08/22/19. She was found to be in afib with RVR and a BNP of 2800. She was diuresed. Her sotalol was held initially with low BP but this was resumed prior to discharge. She converted back to SR 08/24/19 without DCCV. She did have an echo which showed reduced EF 25-30%. Of note, she was being treated for shingles at the time of her afib episode.   On follow up today, patient was scheduled for TEE/DCCV on 01/18/20 but arrived in SR. She had done well until this past week when she started having increased symptoms of SOB. Device clinic confirmed she was in afib. She is in SR today. Patient reports she got a steroid shot in her hip a few days prior to the onset of her symptoms. Of note, she has also been diagnosed with liver cancer and has her first appointment on 04/21/20.  Today, she denies  symptoms of palpitations, chest pain, orthopnea, PND, lower extremity edema, dizziness, presyncope, syncope, bleeding, or neurologic sequela. The patient is tolerating medications without difficulties and is otherwise without complaint today.    Atrial Fibrillation Risk Factors:  she does have symptoms or diagnosis of sleep apnea. she is not compliant with CPAP therapy. she does not have a history of rheumatic fever. she does not have a history of alcohol use. The patient does have a history of early familial atrial fibrillation or other arrhythmias.  she has a BMI of Body mass index is 27.44 kg/m.Marland Kitchen Filed Weights   04/14/20 1501  Weight: 68 kg    Family History  Problem Relation Age of Onset   Alzheimer's disease Mother    Emphysema Father    Diabetes Father    Heart disease Brother    Cancer Brother    Diabetes Paternal Grandmother    Cardiomyopathy Other        several in family have had this      Atrial Fibrillation Management history:  Previous antiarrhythmic drugs: amiodarone, sotalol Previous cardioversions: 05/02/19 Previous ablations: none CHADS2VASC score: 3 Anticoagulation history: warfarin   Past Medical History:  Diagnosis Date   Anxiety    Atrial fibrillation (HCC)    Back pain    Cardiomyopathy, hypertrophic obstructive (HCC)    Complication of anesthesia    Degeneration of cervical intervertebral disc    Dyslipidemia    Edema    Gastroesophageal reflux disease  Hernia, abdominal    Hypokalemia    ICD (implantable cardioverter-defibrillator) in place    Mitral regurgitation    Pinched nerve    PONV (postoperative nausea and vomiting)    Presence of permanent cardiac pacemaker    Reflux    Right shoulder pain    Shortness of breath on exertion    Sleep apnea    Past Surgical History:  Procedure Laterality Date   CARDIAC CATHETERIZATION     CARDIOVERSION N/A 05/02/2019   Procedure: CARDIOVERSION;  Surgeon:  Thurmon Fair, MD;  Location: [facility] ENDOSCOPY;  Service: Cardiovascular;  Laterality: N/A;   COLONSCOPY     CORONARY ARTERY BYPASS GRAFT     pt denies this. She said she never had bypass surgery.   ICD GENERATOR CHANGE     ICD/BIV ICD FUNCTION(DFT) TEST N/A 06/26/2018   Procedure: ICD/BIV ICD FUNCTION (DFT) TEST;  Surgeon: Marinus Maw, MD;  Location: [facility] INVASIVE CV LAB;  Service: Cardiovascular;  Laterality: N/A;   MYOMECTOMY     OTHER HEART/PERICARD OPS     PACEMAKER INSERTION     TEE WITHOUT CARDIOVERSION N/A 05/02/2019   Procedure: TRANSESOPHAGEAL ECHOCARDIOGRAM (TEE);  Surgeon: Thurmon Fair, MD;  Location: [facility] ENDOSCOPY;  Service: Cardiovascular;  Laterality: N/A;   TONSILLECTOMY     VAGINAL HYSTERECTOMY      Current Outpatient Medications  Medication Sig Dispense Refill   atorvastatin (LIPITOR) 40 MG tablet TAKE ONE TABLET BY MOUTH DAILY AT 6 PM 90 tablet 3   Carboxymethylcellulose Sodium (ARTIFICIAL TEARS OP) Place 1 drop into both eyes daily as needed (dry eyes).     furosemide (LASIX) 20 MG tablet Take 1 tablet (20 mg total) by mouth daily. 90 tablet 3   furosemide (LASIX) 40 MG tablet Take 1 tablet (40 mg total) by mouth daily. 90 tablet 3   magnesium oxide (MAG-OX) 400 MG tablet Take 1 tablet (400 mg total) by mouth daily. 90 tablet 3   meclizine (ANTIVERT) 25 MG tablet Take 25 mg by mouth 3 (three) times daily as needed for dizziness.      omeprazole (PRILOSEC) 20 MG capsule Take 20 mg by mouth daily.     potassium chloride (KLOR-CON) 10 MEQ tablet Take 2 tablets (20 mEq total) by mouth daily. 90 tablet 0   sotalol (BETAPACE) 80 MG tablet Take 1 tablet (80 mg total) by mouth 2 (two) times daily. 180 tablet 3   spironolactone (ALDACTONE) 25 MG tablet TAKE 1/2 TABLET BY MOUTH DAILY 45 tablet 3   warfarin (COUMADIN) 3 MG tablet Take 3 mg by mouth every evening.     No current facility-administered medications for this encounter.    Allergies  Allergen  Reactions   Codeine Nausea And Vomiting   Lortab [Hydrocodone-Acetaminophen] Nausea And Vomiting    Social History   Socioeconomic History   Marital status: Divorced    Spouse name: Not on file   Number of children: 3   Years of education: Not on file   Highest education level: Some college, no degree  Occupational History   Not on file  Tobacco Use   Smoking status: Never Smoker   Smokeless tobacco: Never Used  Vaping Use   Vaping Use: Never used  Substance and Sexual Activity   Alcohol use: Not Currently    Comment: "once in awhile"   Drug use: Never   Sexual activity: Not Currently  Other Topics Concern   Not on file  Social History Narrative   Lives at  home alone    Right handed   Caffeine: quit Jan 2020   Social Determinants of Health   Financial Resource Strain:    Difficulty of Paying Living Expenses: Not on file  Food Insecurity:    Worried About Programme researcher, broadcasting/film/videounning Out of Food in the Last Year: Not on file   [employer] Out of Food in the Last Year: Not on file  Transportation Needs:    Lack of Transportation (Medical): Not on file   Lack of Transportation (Non-Medical): Not on file  Physical Activity:    Days of Exercise per Week: Not on file   Minutes of Exercise per Session: Not on file  Stress:    Feeling of Stress : Not on file  Social Connections:    Frequency of Communication with Friends and Family: Not on file   Frequency of Social Gatherings with Friends and Family: Not on file   Attends Religious Services: Not on file   Active Member of Clubs or Organizations: Not on file   Attends BankerClub or Organization Meetings: Not on file   Marital Status: Not on file  Intimate Partner Violence:    Fear of Current or Ex-Partner: Not on file   Emotionally Abused: Not on file   Physically Abused: Not on file   Sexually Abused: Not on file     ROS- All systems are reviewed and negative except as per the HPI above.  Physical Exam: Vitals:     04/14/20 1501  BP: 112/70  Pulse: 69  SpO2: 96%  Weight: 68 kg  Height: 5\' 2"  (1.575 m)    GEN- The patient is well appearing elderly female, alert and oriented x 3 today.   HEENT-head normocephalic, atraumatic, sclera clear, conjunctiva pink, hearing intact, trachea midline. Lungs- Clear to ausculation bilaterally, normal work of breathing Heart- Regular rate and rhythm, no murmurs, rubs or gallops  GI- soft, NT, ND, + BS Extremities- no clubbing, cyanosis, or edema MS- no significant deformity or atrophy Skin- no rash or lesion Psych- euthymic mood, full affect Neuro- strength and sensation are intact   Wt Readings from Last 3 Encounters:  04/14/20 68 kg  02/04/20 66.7 kg  01/15/20 67.8 kg    EKG today demonstrates SR HR 69, PACs, IVCD, NST, PR 178, QRS 152, QTc 542  Echo 06/23/18 demonstrated  1. The left ventricle has LVEF is approximately 45% with hypokinesis/akinesis of the inferior, inferoseptal walls. The cavity size is mild to moderately increased. There is no left ventricular wall thickness. Echo evidence of impaired relaxation  diastolic filling patterns.  2. Moderately dilated left atrial size.  3. Normal right atrial size.  4. The mitral valve normal in structure. Regurgitation is mild by color flow Doppler.  5. Normal tricuspid valve.  6. Tricuspid regurgitation is mild.  7. The aortic valve tricuspid. There is mild thickening and mild calcification of the aortic valve. Aortic valve regurgitation is mild by color flow Doppler.  8. No atrial level shunt detected by color flow Doppler.  Echo 08/23/19 [facility]  1. Overall left ventricular ejection fraction is estimated at 25 to 30%.  2. Severely decreased global left ventricular systolic function.  3. Left ventricular wall motion abnormalities exist. See wall motion findings.  4. Moderately increased left ventricular internal cavity size.  5. Moderately dilated left atrium.  6. Mildly dilated right  atrium.  7. Moderate mitral valve regurgitation.  8. Mild-moderate tricuspid regurgitation.  9. Mild to moderate aortic regurgitation. 10. Mildly elevated pulmonary artery systolic  pressure.  Epic records are reviewed at length today  Assessment and Plan:  1. Persistent atrial fibrillation Recall she failed amiodarone previously 2/2 increased DFT with failed defibrillation. Suspect recent episode 2/2 steroid injection. Back in SR now. We discussed dofetilide admission today. Will hold given possible steroid trigger and new diagnosis of liver cancer.  She is not opposed to ablation although her severely dilated LA is a disincentive for ablation.  Continue sotalol 80 mg BID  This patients CHA2DS2-VASc Score and unadjusted Ischemic Stroke Rate (% per year) is equal to 3.2 % stroke rate/year from a score of 3  Above score calculated as 1 point each if present [CHF, HTN, DM, Vascular=MI/PAD/Aortic Plaque, Age if 65-74, or Female] Above score calculated as 2 points each if present [Age > 75, or Stroke/TIA/TE]  2. H/o OSA/snoring/witnessed apnea Sleep study was scheduled for 10/3, cancelled because pt needed liver studies that day.  3. Hypertrophic obstructive cardiomyopathy S/p myectomy 1996.  4. Systolic CHF EF on recent echo 25-30%.  No signs or symptoms of fluid overload today.   Follow up in the AF clinic in 2 months.    Jorja Loa PA-C Afib Clinic [facility] [address] [place], [place] [address] [phone] 04/14/2020 3:20 PM

## 2020-04-21 ENCOUNTER — Telehealth: Payer: Self-pay

## 2020-04-21 NOTE — Telephone Encounter (Signed)
Manual transmission received and reviewed.  Presenting Rhythm is AF, controlled.  Device not tracking AT/ AF Burden but she has had 45 HVR episodes that appear RVR with rates 130-160, lasting up to 25 seconds.    Pt recently seen in AF clinic on 11/22, she reqursted info be forwarded to them for review.

## 2020-04-21 NOTE — Telephone Encounter (Signed)
Patient called in and states that she sent in a transmission and wants to know if someone can review it and give her a call back to let her know if she was in afib

## 2020-04-24 ENCOUNTER — Encounter (HOSPITAL_COMMUNITY): Payer: Self-pay | Admitting: *Deleted

## 2020-04-24 ENCOUNTER — Encounter (HOSPITAL_COMMUNITY): Payer: Self-pay

## 2020-04-24 NOTE — Telephone Encounter (Signed)
Left message letting patient know afib was seen on her device.

## 2020-04-29 ENCOUNTER — Ambulatory Visit (INDEPENDENT_AMBULATORY_CARE_PROVIDER_SITE_OTHER): Payer: Medicare Other

## 2020-04-29 DIAGNOSIS — I422 Other hypertrophic cardiomyopathy: Secondary | ICD-10-CM | POA: Diagnosis not present

## 2020-04-29 LAB — CUP PACEART REMOTE DEVICE CHECK
Battery Remaining Longevity: 18 mo
Battery Remaining Percentage: 20 %
Brady Statistic RA Percent Paced: 0 %
Brady Statistic RV Percent Paced: 0 %
Date Time Interrogation Session: 20211207005200
HighPow Impedance: 59 Ohm
Implantable Lead Implant Date: 19970916
Implantable Lead Implant Date: 19970916
Implantable Lead Location: 753859
Implantable Lead Location: 753860
Implantable Lead Model: 125
Implantable Lead Model: 4269
Implantable Lead Serial Number: 222062
Implantable Lead Serial Number: 279972
Implantable Pulse Generator Implant Date: 20101020
Lead Channel Impedance Value: 448 Ohm
Lead Channel Pacing Threshold Amplitude: 1.2 V
Lead Channel Pacing Threshold Amplitude: 4.5 V
Lead Channel Pacing Threshold Pulse Width: 0.4 ms
Lead Channel Pacing Threshold Pulse Width: 1 ms
Lead Channel Setting Pacing Amplitude: 2.6 V
Lead Channel Setting Pacing Pulse Width: 0.4 ms
Lead Channel Setting Sensing Sensitivity: 0.6 mV
Pulse Gen Serial Number: 149386

## 2020-05-09 NOTE — Progress Notes (Signed)
Remote ICD transmission.   

## 2020-05-13 ENCOUNTER — Telehealth: Payer: Self-pay | Admitting: Cardiovascular Disease

## 2020-05-13 ENCOUNTER — Other Ambulatory Visit (HOSPITAL_COMMUNITY): Payer: Self-pay

## 2020-05-13 MED ORDER — SOTALOL HCL 80 MG PO TABS: 80.0000 mg | ORAL_TABLET | Freq: Two times a day (BID) | ORAL | 1 refills | Status: AC

## 2020-05-13 NOTE — Telephone Encounter (Signed)
*  STAT* If patient is at the pharmacy, call can be transferred to refill team.   1. Which medications need to be refilled? (please list name of each medication and dose if known)  sotalol (BETAPACE) 80 MG tablet  2. Which pharmacy/location (including street and city if local pharmacy) is medication to be sent to?  [id number] - [place], [place] - [address]  3. Do they need a 30 day or 90 day supply? 90

## 2020-06-16 ENCOUNTER — Ambulatory Visit (HOSPITAL_COMMUNITY): Payer: Medicare Other | Admitting: Physician Assistant

## 2020-06-16 ENCOUNTER — Telehealth: Payer: Self-pay

## 2020-06-16 NOTE — Telephone Encounter (Signed)
Latitude alert received for 162 new HVR events logged 5 seconds to 1 minute 37 seconds w/ rates 130's to 140's bpm; available EGMs suggest 1 NSVT lasting 6 beats and the rest are AF w/ RVR. Presenting appears AF w/ VS 80-120's. Patient reports she has a cold with coughing and sneezing. Is currently taking Mucinex (not DM) and an anti-biotic. Patient reports she has taken several covid test and have been negative. Has completed her vaccine for covid. Requested patient to send manual transmission to review presenting.

## 2020-06-16 NOTE — Telephone Encounter (Signed)
Presenting AF /VS 80-120's. Patient has AF clinic follow up on 06/26/20. Patient aware. Advised I will forward to Dr. Graciela Husbands for changes. Patient reports she is feeling better and denies any cardiac related symptoms.

## 2020-06-17 NOTE — Telephone Encounter (Signed)
Spoke with pt to obtain current HR and BP.  Pt reports BP 92/57 and HR 121.  Pt advised will contact Stacy Carter,RN - Afib clinic with current BP and HR.  Pt verbalizes understanding and thanked Charity fundraiser for the call.

## 2020-06-23 ENCOUNTER — Telehealth (HOSPITAL_COMMUNITY): Payer: Self-pay | Admitting: *Deleted

## 2020-06-23 ENCOUNTER — Ambulatory Visit (HOSPITAL_COMMUNITY): Payer: Medicare Other | Admitting: Physician Assistant

## 2020-06-23 NOTE — Telephone Encounter (Signed)
Pt in NSR on presenting.

## 2020-06-23 NOTE — Telephone Encounter (Signed)
L  thx  maybe seh will be back in sinus

## 2020-06-23 NOTE — Telephone Encounter (Signed)
Patient called in stating she feels like she went back into normal rhythm this morning - she has sent a transmission to confirm.

## 2020-06-23 NOTE — Telephone Encounter (Signed)
Pt notified of return to normal rhythm this AM. Pt had called Dr. Odessa Fleming office earlier this morning requesting EP appt in [place] to reduce her travel time. She has an appt with Dr. Ladona Ridgel 2/1

## 2020-06-24 ENCOUNTER — Encounter: Payer: Medicare Other | Admitting: Internal Medicine

## 2020-06-26 ENCOUNTER — Ambulatory Visit (HOSPITAL_COMMUNITY): Payer: Medicare Other | Admitting: Physician Assistant

## 2020-08-18 ENCOUNTER — Other Ambulatory Visit: Payer: Self-pay | Admitting: Internal Medicine

## 2020-08-20 NOTE — Telephone Encounter (Signed)
This is a A-Fib clinic pt 

## 2020-09-01 ENCOUNTER — Encounter: Payer: Medicare Other | Admitting: Internal Medicine

## 2020-09-01 DIAGNOSIS — I480 Paroxysmal atrial fibrillation: Secondary | ICD-10-CM

## 2020-09-01 DIAGNOSIS — Z95810 Presence of automatic (implantable) cardiac defibrillator: Secondary | ICD-10-CM

## 2020-09-01 DIAGNOSIS — I421 Obstructive hypertrophic cardiomyopathy: Secondary | ICD-10-CM

## 2020-12-04 ENCOUNTER — Other Ambulatory Visit: Payer: Self-pay | Admitting: Internal Medicine

## 2021-05-24 DEATH — deceased

## 2021-08-24 ENCOUNTER — Encounter: Payer: Self-pay | Admitting: Internal Medicine

## 2021-08-24 NOTE — Telephone Encounter (Signed)
error
